# Patient Record
Sex: Male | Born: 1940 | Race: Black or African American | Hispanic: Yes | Marital: Married
Health system: Southern US, Community
[De-identification: ages and names within clinical notes are randomized; demographics above are authoritative.]

## PROBLEM LIST (undated history)

## (undated) DIAGNOSIS — G620 Drug-induced polyneuropathy: Secondary | ICD-10-CM

## (undated) DIAGNOSIS — R768 Other specified abnormal immunological findings in serum: Secondary | ICD-10-CM

## (undated) DIAGNOSIS — I509 Heart failure, unspecified: Secondary | ICD-10-CM

## (undated) DIAGNOSIS — T451X5A Adverse effect of antineoplastic and immunosuppressive drugs, initial encounter: Secondary | ICD-10-CM

## (undated) DIAGNOSIS — R7689 Other specified abnormal immunological findings in serum: Secondary | ICD-10-CM

## (undated) DIAGNOSIS — N189 Chronic kidney disease, unspecified: Secondary | ICD-10-CM

## (undated) HISTORY — DX: Adverse effect of antineoplastic and immunosuppressive drugs, initial encounter: T45.1X5A

## (undated) HISTORY — DX: Drug-induced polyneuropathy: G62.0

## (undated) HISTORY — DX: Other specified abnormal immunological findings in serum: R76.8

## (undated) HISTORY — DX: Other specified abnormal immunological findings in serum: R76.89

## (undated) HISTORY — DX: Chronic kidney disease, unspecified: N18.9

## (undated) HISTORY — DX: Heart failure, unspecified: I50.9

---

## 2017-12-16 ENCOUNTER — Other Ambulatory Visit: Payer: Self-pay

## 2017-12-16 ENCOUNTER — Encounter (HOSPITAL_COMMUNITY): Payer: Self-pay | Admitting: Hematology

## 2017-12-16 ENCOUNTER — Inpatient Hospital Stay (HOSPITAL_COMMUNITY): Payer: Medicare PPO | Attending: Hematology | Admitting: Hematology

## 2017-12-16 DIAGNOSIS — N189 Chronic kidney disease, unspecified: Secondary | ICD-10-CM | POA: Insufficient documentation

## 2017-12-16 DIAGNOSIS — G620 Drug-induced polyneuropathy: Secondary | ICD-10-CM | POA: Insufficient documentation

## 2017-12-16 DIAGNOSIS — Z8572 Personal history of non-Hodgkin lymphomas: Secondary | ICD-10-CM | POA: Diagnosis not present

## 2017-12-16 DIAGNOSIS — C8330 Diffuse large B-cell lymphoma, unspecified site: Secondary | ICD-10-CM | POA: Insufficient documentation

## 2017-12-16 DIAGNOSIS — I509 Heart failure, unspecified: Secondary | ICD-10-CM | POA: Diagnosis not present

## 2017-12-16 DIAGNOSIS — C8332 Diffuse large B-cell lymphoma, intrathoracic lymph nodes: Secondary | ICD-10-CM

## 2017-12-16 NOTE — Assessment & Plan Note (Signed)
1.  Stage IV DLBCL with bone involvement, mediastinal and right axillary lymphadenopathy: - IPI score of 3, weight loss of 20 pounds and right upper extremity swelling at presentation, PET positive in the left pelvis, acetabulum, ribs, adenopathy as described above - Treated with 6 cycles of R-CHOP from November 09, 2013 through February 22, 2014 - Does not report any B symptoms in the last 6 months.  Physical examination was within normal limits.  His blood work from 11/15/2017 was also within normal limits.  He continues to be in remission at this time.  I have discussed the results of the CT scan of the chest, abdomen and pelvis dated 11/15/2017 which did not show any evidence of adenopathy or recurrence of his lymphoma.  We will see him back in 6 months with repeat blood work.  We will plan to repeat scans in 1 year.  2.  Hepatitis B surface antigen positive: -Treated with entecavir 0.5 mg daily from March 2015 through August 2015  3.  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  Follows up with Dr. Luiz Ochoa.  4.  CKD: -Mild CKD is stable.  Creatinine is 1.33.  5.  Peripheral neuropathy: He has numbness in the fingertips and feet which is constant and stable.  Still continues to have some stiffness in the hands occasionally.

## 2017-12-16 NOTE — Progress Notes (Signed)
[facility] NOTE  Patient Care Team: Cathie Olden, MD as PCP - General (Family Medicine)  CHIEF COMPLAINTS/PURPOSE OF CONSULTATION:  Follow-up of diffuse large B-cell lymphoma.  HISTORY OF PRESENTING ILLNESS:  Christopher Briggs 77 y.o. male is seen in consultation today for follow-up of diffuse large B-cell lymphoma.  He presented in early 2015 with the 20 pound weight loss, right upper extremity swelling and right axillary adenopathy.  Scans revealed mediastinal adenopathy and bone involvement.  Biopsy was indicative of large B-cell lymphoma.  He was treated with 6 cycles of R-CHOP, which he tolerated very well.  His right upper extremity swelling subsided.  He has been on surveillance since then.  He developed anthracycline induced congestive heart failure which is also well controlled at this time.  Interval history: He is feeling very well and is seen with his wife today.  He denies any fevers, night sweats or weight loss in the last 6 months.  He denies any infections or hospitalizations.  He continues to have numbness in the fingertips and toes.  He denies any symptoms of PND or orthopnea.  No new onset pains were reported.  Appetite has been great.     MEDICAL HISTORY:  Past Medical History:  Diagnosis Date  . CHF (congestive heart failure) ([place])   . Chronic kidney disease   . Hepatitis B surface antigen positive   . Neuropathy due to chemotherapeutic drug [facility])     SURGICAL HISTORY: History reviewed. No pertinent surgical history.  SOCIAL HISTORY: Social History   Socioeconomic History  . Marital status: Married    Spouse name: Not on file  . Number of children: Not on file  . Years of education: Not on file  . Highest education level: Not on file  Occupational History  . Not on file  Social Needs  . Financial resource strain: Not on file  . Food insecurity:    Worry: Not on file    Inability: Not on file  . Transportation needs:     Medical: Not on file    Non-medical: Not on file  Tobacco Use  . Smoking status: Not on file  Substance and Sexual Activity  . Alcohol use: Not on file  . Drug use: Not on file  . Sexual activity: Not on file  Lifestyle  . Physical activity:    Days per week: Not on file    Minutes per session: Not on file  . Stress: Not on file  Relationships  . Social connections:    Talks on phone: Not on file    Gets together: Not on file    Attends religious service: Not on file    Active member of club or organization: Not on file    Attends meetings of clubs or organizations: Not on file    Relationship status: Not on file  . Intimate partner violence:    Fear of current or ex partner: Not on file    Emotionally abused: Not on file    Physically abused: Not on file    Forced sexual activity: Not on file  Other Topics Concern  . Not on file  Social History Narrative  . Not on file    FAMILY HISTORY: History reviewed. No pertinent family history.  ALLERGIES:  has no allergies on file.  MEDICATIONS:  Current Outpatient Medications  Medication Sig Dispense Refill  . carvedilol (COREG) 6.25 MG tablet     . celecoxib (CELEBREX) 200 MG capsule     .  clonazePAM (KLONOPIN) 0.5 MG tablet TAKE 1 TO 2 TABLETS BY MOUTH TWICE A DAY AS NEEDED ANXIETY AND 1 TABLET AT BEDTIME FOR SLEEP  0  . furosemide (LASIX) 40 MG tablet TAKE 1 TABLET EVERY MORNING FOR FLUID    . losartan (COZAAR) 50 MG tablet     . Magnesium Oxide 400 MG CAPS Take by mouth.    . meloxicam (MOBIC) 15 MG tablet Take by mouth.    . potassium chloride (K-DUR,KLOR-CON) 10 MEQ tablet TAKE 1 TABLET TWICE DAILY (SUBSTITUTED FOR  K-DUR)    . pravastatin (PRAVACHOL) 20 MG tablet Take by mouth.    . tamsulosin (FLOMAX) 0.4 MG CAPS capsule Take by mouth.     No current facility-administered medications for this visit.     REVIEW OF SYSTEMS:   Constitutional: Denies fevers, chills or abnormal night sweats Eyes: Denies  blurriness of vision, double vision or watery eyes Ears, nose, mouth, throat, and face: Denies mucositis or sore throat Respiratory: Denies cough, dyspnea or wheezes Cardiovascular: Denies palpitation, chest discomfort or lower extremity swelling Gastrointestinal:  Denies nausea, heartburn or change in bowel habits Skin: Denies abnormal skin rashes Lymphatics: Denies new lymphadenopathy or easy bruising Neurological: Denies any weakness but has some numbness in the fingertips and toes.   Behavioral/Psych: Mood is stable, no new changes  All other systems were reviewed with the patient and are negative.  PHYSICAL EXAMINATION: ECOG PERFORMANCE STATUS: 1 - Symptomatic but completely ambulatory  Vitals:   12/16/17 1324  BP: (!) 152/87  Pulse: 84  Resp: 16  SpO2: 98%   Filed Weights   12/16/17 1324  Weight: 234 lb (106.1 kg)    GENERAL:alert, no distress and comfortable SKIN: skin color, texture, turgor are normal, no rashes or significant lesions EYES: normal, conjunctiva are pink and non-injected, sclera clear OROPHARYNX:no exudate, no erythema and lips, buccal mucosa, and tongue normal  NECK: supple, thyroid normal size, non-tender, without nodularity LYMPH:  no palpable lymphadenopathy in the cervical, axillary or inguinal LUNGS: clear to auscultation and percussion with normal breathing effort HEART: regular rate & rhythm and no murmurs and no lower extremity edema ABDOMEN:abdomen soft, non-tender and normal bowel sounds Musculoskeletal:no cyanosis of digits and no clubbing  PSYCH: alert & oriented x 3 with fluent speech   LABORATORY DATA:  I have reviewed the data as listed No results found for: WBC, HGB, HCT, MCV, PLT   Chemistry   No results found for: NA, K, CL, CO2, BUN, CREATININE, GLU No results found for: CALCIUM, ALKPHOS, AST, ALT, BILITOT     RADIOGRAPHIC STUDIES: I have personally reviewed the radiological images as listed and agreed with the findings in the  report.  ASSESSMENT & PLAN:  Diffuse large B cell lymphoma (HCC) 1.  Stage IV DLBCL with bone involvement, mediastinal and right axillary lymphadenopathy: - IPI score of 3, weight loss of 20 pounds and right upper extremity swelling at presentation, PET positive in the left pelvis, acetabulum, ribs, adenopathy as described above - Treated with 6 cycles of R-CHOP from November 09, 2013 through February 22, 2014 - Does not report any B symptoms in the last 6 months.  Physical examination was within normal limits.  His blood work from 11/15/2017 was also within normal limits.  He continues to be in remission at this time.  I have discussed the results of the CT scan of the chest, abdomen and pelvis dated 11/15/2017 which did not show any evidence of adenopathy or recurrence of his  lymphoma.  We will see him back in 6 months with repeat blood work.  We will plan to repeat scans in 1 year.  2.  Hepatitis B surface antigen positive: -Treated with entecavir 0.5 mg daily from March 2015 through August 2015  3.  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  Follows up with Dr. Luiz Ochoa.  4.  CKD: -Mild CKD is stable.  Creatinine is 1.33.  5.  Peripheral neuropathy: He has numbness in the fingertips and feet which is constant and stable.  Still continues to have some stiffness in the hands occasionally.  No orders of the defined types were placed in this encounter.   All questions were answered. The patient knows to call the clinic with any problems, questions or concerns.      Derek Jack, MD 12/16/2017 5:48 PM

## 2017-12-17 ENCOUNTER — Encounter (HOSPITAL_COMMUNITY): Payer: Self-pay | Admitting: General Practice

## 2017-12-18 ENCOUNTER — Ambulatory Visit
Admission: RE | Admit: 2017-12-18 | Discharge: 2017-12-18 | Disposition: A | Discharge status: Home / Self Care | Payer: Self-pay | Source: Ambulatory Visit | Attending: Hematology | Admitting: Hematology

## 2017-12-18 ENCOUNTER — Other Ambulatory Visit (HOSPITAL_COMMUNITY): Payer: Self-pay | Admitting: Hematology

## 2017-12-18 DIAGNOSIS — R1084 Generalized abdominal pain: Secondary | ICD-10-CM

## 2018-01-21 NOTE — Progress Notes (Signed)
Faxed

## 2018-06-18 ENCOUNTER — Other Ambulatory Visit (HOSPITAL_COMMUNITY): Payer: Self-pay

## 2018-06-18 DIAGNOSIS — C8332 Diffuse large B-cell lymphoma, intrathoracic lymph nodes: Secondary | ICD-10-CM

## 2018-06-19 ENCOUNTER — Ambulatory Visit (HOSPITAL_COMMUNITY): Payer: Medicare PPO | Admitting: Hematology

## 2018-06-19 ENCOUNTER — Other Ambulatory Visit (HOSPITAL_COMMUNITY): Payer: Medicare PPO

## 2018-08-18 ENCOUNTER — Other Ambulatory Visit (HOSPITAL_COMMUNITY): Payer: Medicare PPO

## 2018-08-25 ENCOUNTER — Ambulatory Visit (HOSPITAL_COMMUNITY): Payer: Medicare PPO | Admitting: Hematology

## 2018-08-28 ENCOUNTER — Encounter (HOSPITAL_COMMUNITY): Payer: Self-pay | Admitting: Hematology

## 2018-08-28 ENCOUNTER — Inpatient Hospital Stay (HOSPITAL_COMMUNITY): Payer: Medicare Other | Attending: Hematology

## 2018-08-28 ENCOUNTER — Inpatient Hospital Stay (HOSPITAL_BASED_OUTPATIENT_CLINIC_OR_DEPARTMENT_OTHER): Payer: Medicare Other | Admitting: Hematology

## 2018-08-28 ENCOUNTER — Other Ambulatory Visit: Payer: Self-pay

## 2018-08-28 VITALS — BP 148/93 | HR 76 | Temp 98.0°F | Resp 16 | Wt 229.0 lb

## 2018-08-28 DIAGNOSIS — Z8572 Personal history of non-Hodgkin lymphomas: Secondary | ICD-10-CM

## 2018-08-28 DIAGNOSIS — I509 Heart failure, unspecified: Secondary | ICD-10-CM

## 2018-08-28 DIAGNOSIS — N189 Chronic kidney disease, unspecified: Secondary | ICD-10-CM

## 2018-08-28 DIAGNOSIS — C8332 Diffuse large B-cell lymphoma, intrathoracic lymph nodes: Secondary | ICD-10-CM

## 2018-08-28 DIAGNOSIS — G620 Drug-induced polyneuropathy: Secondary | ICD-10-CM

## 2018-08-28 DIAGNOSIS — T451X5A Adverse effect of antineoplastic and immunosuppressive drugs, initial encounter: Secondary | ICD-10-CM

## 2018-08-28 LAB — COMPREHENSIVE METABOLIC PANEL
ALBUMIN: 4 g/dL (ref 3.5–5.0)
ALT: 24 U/L (ref 0–44)
AST: 26 U/L (ref 15–41)
Alkaline Phosphatase: 64 U/L (ref 38–126)
Anion gap: 7 (ref 5–15)
BILIRUBIN TOTAL: 1.1 mg/dL (ref 0.3–1.2)
BUN: 17 mg/dL (ref 8–23)
CO2: 26 mmol/L (ref 22–32)
CREATININE: 1.17 mg/dL (ref 0.61–1.24)
Calcium: 9.2 mg/dL (ref 8.9–10.3)
Chloride: 107 mmol/L (ref 98–111)
GFR calc Af Amer: >60 mL/min (ref >60)
GFR, EST NON AFRICAN AMERICAN: 60 mL/min — AB (ref >60)
GLUCOSE: 105 mg/dL — AB (ref 70–99)
Potassium: 4.6 mmol/L (ref 3.5–5.1)
Sodium: 140 mmol/L (ref 135–145)
TOTAL PROTEIN: 6.6 g/dL (ref 6.5–8.1)

## 2018-08-28 LAB — CBC WITH DIFFERENTIAL/PLATELET
ABS IMMATURE GRANULOCYTES: 0.03 10*3/uL (ref 0.00–0.07)
Basophils Absolute: 0 10*3/uL (ref 0.0–0.1)
Basophils Relative: 0 %
Eosinophils Absolute: 0.3 10*3/uL (ref 0.0–0.5)
Eosinophils Relative: 3 %
HEMATOCRIT: 50.2 % (ref 39.0–52.0)
Hemoglobin: 15.7 g/dL (ref 13.0–17.0)
IMMATURE GRANULOCYTES: 0 %
LYMPHS ABS: 1.7 10*3/uL (ref 0.7–4.0)
Lymphocytes Relative: 19 %
MCH: 27.8 pg (ref 26.0–34.0)
MCHC: 31.3 g/dL (ref 30.0–36.0)
MCV: 89 fL (ref 80.0–100.0)
MONOS PCT: 7 %
Monocytes Absolute: 0.7 10*3/uL (ref 0.1–1.0)
NEUTROS ABS: 6.2 10*3/uL (ref 1.7–7.7)
NEUTROS PCT: 71 %
Platelets: 177 10*3/uL (ref 150–400)
RBC: 5.64 MIL/uL (ref 4.22–5.81)
RDW: 14.3 % (ref 11.5–15.5)
WBC: 8.9 10*3/uL (ref 4.0–10.5)
nRBC: 0 % (ref 0.0–0.2)

## 2018-08-28 LAB — LACTATE DEHYDROGENASE: LDH: 192 U/L (ref 98–192)

## 2018-08-28 MED ORDER — SODIUM CHLORIDE 0.9% FLUSH
10.0000 mL | INTRAVENOUS | Status: DC | PRN
  Administered 2018-08-28: 10 mL via INTRAVENOUS
  Filled 2018-08-28: qty 10

## 2018-08-28 MED ORDER — HEPARIN SOD (PORK) LOCK FLUSH 100 UNIT/ML IV SOLN
500.0000 [IU] | Freq: Once | INTRAVENOUS | Status: AC
  Administered 2018-08-28: 500 [IU] via INTRAVENOUS

## 2018-08-28 NOTE — Progress Notes (Signed)
[address], [place] [address]   CLINIC:  Medical Oncology/Hematology  PCP:  Cathie Olden, [place] [id number] [phone]   REASON FOR VISIT: Follow-up for diffuse large B-cell lymphoma.  PREVIOUS THERAPY: RCHOP  CURRENT THERAPY: observation  BRIEF ONCOLOGIC HISTORY:    Diffuse large B cell lymphoma ([place])   10/2013 Initial Diagnosis    Diffuse large B cell lymphoma ([place])    11/09/2013 - 02/22/2014 Chemotherapy    6 cycles of R-CHOP     05/25/2014 Adverse Reaction    Congestive heart failure, anthracycline induced - Echo on 05/25/2014 shows EF of 30 to 35% - Improved EF of 50 to 55% on echo in March 2016    11/15/2017 Imaging    CT scan of the chest, abdomen and pelvis did not show any evidence of adenopathy or recurrence.     INTERVAL HISTORY:  Mr. Montesdeoca 78 y.o. male returns for routine follow-up for diffuse large B-cell lymphoma. He does report having numbness in his finger tips and feet. He reports it has been a little worse in the past 6 months. Some days he has problems buttoning his shirt other days he can do it. Denies any nausea, vomiting, or diarrhea. Denies any new pains. Had not noticed any recent bleeding such as epistaxis, hematuria or hematochezia. Denies recent chest pain on exertion, shortness of breath on minimal exertion, pre-syncopal episodes, or palpitations. Denies any recent fevers, infections, or recent hospitalizations. Patient reports appetite at 100% and energy level at 100%. He is maintaining his weight at this time.    REVIEW OF SYSTEMS:  Review of Systems  Neurological: Positive for numbness.  All other systems reviewed and are negative.    PAST MEDICAL/SURGICAL HISTORY:  Past Medical History:  Diagnosis Date  . CHF (congestive heart failure) ([place])   . Chronic kidney disease   . Hepatitis B surface antigen positive   . Neuropathy due to chemotherapeutic drug  [facility])    History reviewed. No pertinent surgical history.   SOCIAL HISTORY:  Social History   Socioeconomic History  . Marital status: Married    Spouse name: Not on file  . Number of children: Not on file  . Years of education: Not on file  . Highest education level: Not on file  Occupational History  . Not on file  Social Needs  . Financial resource strain: Not on file  . Food insecurity:    Worry: Not on file    Inability: Not on file  . Transportation needs:    Medical: Not on file    Non-medical: Not on file  Tobacco Use  . Smoking status: Not on file  Substance and Sexual Activity  . Alcohol use: Not on file  . Drug use: Not on file  . Sexual activity: Not on file  Lifestyle  . Physical activity:    Days per week: Not on file    Minutes per session: Not on file  . Stress: Not on file  Relationships  . Social connections:    Talks on phone: Not on file    Gets together: Not on file    Attends religious service: Not on file    Active member of club or organization: Not on file    Attends meetings of clubs or organizations: Not on file    Relationship status: Not on file  . Intimate partner violence:    Fear of current or ex partner: Not  on file    Emotionally abused: Not on file    Physically abused: Not on file    Forced sexual activity: Not on file  Other Topics Concern  . Not on file  Social History Narrative  . Not on file    FAMILY HISTORY:  History reviewed. No pertinent family history.  CURRENT MEDICATIONS:  Outpatient Encounter Medications as of 08/28/2018  Medication Sig  . furosemide (LASIX) 40 MG tablet TAKE 1 TABLET EVERY MORNING FOR FLUID  . pravastatin (PRAVACHOL) 20 MG tablet Take by mouth.  . tamsulosin (FLOMAX) 0.4 MG CAPS capsule Take by mouth.  . vitamin C (ASCORBIC ACID) 500 MG tablet Take by mouth.  . clonazePAM (KLONOPIN) 0.5 MG tablet TAKE 1 TO 2 TABLETS BY MOUTH TWICE A DAY AS NEEDED ANXIETY AND 1 TABLET AT BEDTIME FOR SLEEP    . [DISCONTINUED] bisacodyl (DULCOLAX) 5 MG EC tablet Take by mouth.  . [DISCONTINUED] carvedilol (COREG) 6.25 MG tablet   . [DISCONTINUED] celecoxib (CELEBREX) 200 MG capsule   . [DISCONTINUED] Docusate Sodium 100 MG capsule Take by mouth.  . [DISCONTINUED] ferrous sulfate 325 (65 FE) MG tablet Take by mouth.  . [DISCONTINUED] losartan (COZAAR) 50 MG tablet   . [DISCONTINUED] Magnesium Oxide 400 MG CAPS Take by mouth.  . [DISCONTINUED] meloxicam (MOBIC) 15 MG tablet Take by mouth.  . [DISCONTINUED] potassium chloride (K-DUR,KLOR-CON) 10 MEQ tablet TAKE 1 TABLET TWICE DAILY (SUBSTITUTED FOR  K-DUR)   Facility-Administered Encounter Medications as of 08/28/2018  Medication  . [COMPLETED] heparin lock flush 100 unit/mL  . sodium chloride flush (NS) 0.9 % injection 10 mL    ALLERGIES:  Not on File   PHYSICAL EXAM:  ECOG Performance status: 1  Vitals:   08/28/18 1126  BP: (!) 148/93  Pulse: 76  Resp: 16  Temp: 98 F (36.7 C)  SpO2: 99%   Filed Weights   08/28/18 1126  Weight: 229 lb (103.9 kg)    Physical Exam Constitutional:      Appearance: Normal appearance. He is normal weight.  Cardiovascular:     Rate and Rhythm: Normal rate and regular rhythm.     Heart sounds: Normal heart sounds.  Pulmonary:     Effort: Pulmonary effort is normal.     Breath sounds: Normal breath sounds.  Abdominal:     General: Abdomen is flat.     Palpations: Abdomen is soft.  Musculoskeletal: Normal range of motion.  Skin:    General: Skin is warm and dry.  Neurological:     Mental Status: He is alert and oriented to person, place, and time. Mental status is at baseline.  Psychiatric:        Mood and Affect: Mood normal.        Behavior: Behavior normal.        Thought Content: Thought content normal.        Judgment: Judgment normal.    No palpable adenopathy or splenomegaly. Chest is bilaterally clear to auscultation. Cardiovascular: S1-S2 regular rate and rhythm.  LABORATORY  DATA:  I have reviewed the labs as listed.  CBC    Component Value Date/Time   WBC 8.9 08/28/2018 1034   RBC 5.64 08/28/2018 1034   HGB 15.7 08/28/2018 1034   HCT 50.2 08/28/2018 1034   PLT 177 08/28/2018 1034   MCV 89.0 08/28/2018 1034   MCH 27.8 08/28/2018 1034   MCHC 31.3 08/28/2018 1034   RDW 14.3 08/28/2018 1034   LYMPHSABS 1.7 08/28/2018 1034  MONOABS 0.7 08/28/2018 1034   EOSABS 0.3 08/28/2018 1034   BASOSABS 0.0 08/28/2018 1034   CMP Latest Ref Rng & Units 08/28/2018  Glucose 70 - 99 mg/dL 105(H)  BUN 8 - 23 mg/dL 17  Creatinine 0.61 - 1.24 mg/dL 1.17  Sodium 135 - 145 mmol/L 140  Potassium 3.5 - 5.1 mmol/L 4.6  Chloride 98 - 111 mmol/L 107  CO2 22 - 32 mmol/L 26  Calcium 8.9 - 10.3 mg/dL 9.2  Total Protein 6.5 - 8.1 g/dL 6.6  Total Bilirubin 0.3 - 1.2 mg/dL 1.1  Alkaline Phos 38 - 126 U/L 64  AST 15 - 41 U/L 26  ALT 0 - 44 U/L 24       DIAGNOSTIC IMAGING:  I have independently reviewed the scans and discussed with the patient.   I have reviewed Francene Finders, NP's note and agree with the documentation.  I personally performed a face-to-face visit, made revisions and my assessment and plan is as follows.    ASSESSMENT & PLAN:   Diffuse large B cell lymphoma (HCC) 1.  Stage IV DLBCL with bone involvement, mediastinal and right axillary lymphadenopathy: - IPI score of 3, weight loss of 20 pounds and right upper extremity swelling at presentation, PET positive in the left pelvis, acetabulum, ribs, adenopathy as described above - Treated with 6 cycles of R-CHOP from November 09, 2013 through February 22, 2014 - CT CAP on 11/15/2017 did not show any evidence of adenopathy or recurrence of lymphoma. -Physical examination today did not reveal any palpable adenopathy.  He denies having any fevers, night sweats or weight loss in the last 6 months.  No splenomegaly.  His blood work today was within normal limits. - We will see him back in 6 months for follow-up.  Plan to  repeat blood work and last CT scans prior to next visit.  2.  Hepatitis B surface antigen positive: -Treated with entecavir 0.5 mg daily from March 2015 through August 2015  3.  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  Follows up with Dr. Luiz Ochoa.  4.  CKD: -Mild CKD is stable.  Creatinine today is 1.17.  5.  Peripheral neuropathy: -He reports that numbness in the fingertips and feet has slightly worsened in the last 6 months. -He is not having any pins-and-needles sensation at nighttime.  Hence I would not start on any medication at this time.  To do all activities without any problems.        Orders placed this encounter:  Orders Placed This Encounter  Procedures  . CT CHEST W CONTRAST  . CT Abdomen Pelvis W Contrast  . Lactate dehydrogenase  . CBC with Differential/Platelet  . Comprehensive metabolic panel  . Lactate dehydrogenase  . Draw extra clot tube      Derek Jack, MD [place] [phone]

## 2018-08-28 NOTE — Progress Notes (Signed)
Christopher Briggs tolerated portacath flush well without complaints or incident. Port accessed with 20 gauge needle with blood return noted then flushed with 10 ml NS and 5 ml Heparin easily per protocol then de-accessed. Pt discharged self ambulatory in satisfactory condition

## 2018-08-28 NOTE — Patient Instructions (Signed)
[facility] at [facility] Discharge Instructions     Thank you for choosing [facility] at [facility] to provide your oncology and hematology care.  To afford each patient quality time with our provider, please arrive at least 15 minutes before your scheduled appointment time.   If you have a lab appointment with the Cancer Center please come in thru the  Main Entrance and check in at the main information desk  You need to re-schedule your appointment should you arrive 10 or more minutes late.  We strive to give you quality time with our providers, and arriving late affects you and other patients whose appointments are after yours.  Also, if you no show three or more times for appointments you may be dismissed from the clinic at the providers discretion.     Again, thank you for choosing [facility].  Our hope is that these requests will decrease the amount of time that you wait before being seen by our physicians.       _____________________________________________________________  Should you have questions after your visit to [facility], please contact our office at [phone] between the hours of 8:00 a.m. and 4:30 p.m.  Voicemails left after 4:00 p.m. will not be returned until the following business day.  For prescription refill requests, have your pharmacy contact our office and allow 72 hours.    Cancer Center Support Programs:   > Cancer Support Group  2nd Tuesday of the month 1pm-2pm, Journey Room    

## 2018-08-28 NOTE — Assessment & Plan Note (Signed)
1.  Stage IV DLBCL with bone involvement, mediastinal and right axillary lymphadenopathy: - IPI score of 3, weight loss of 20 pounds and right upper extremity swelling at presentation, PET positive in the left pelvis, acetabulum, ribs, adenopathy as described above - Treated with 6 cycles of R-CHOP from November 09, 2013 through February 22, 2014 - CT CAP on 11/15/2017 did not show any evidence of adenopathy or recurrence of lymphoma. -Physical examination today did not reveal any palpable adenopathy.  He denies having any fevers, night sweats or weight loss in the last 6 months.  No splenomegaly.  His blood work today was within normal limits. - We will see him back in 6 months for follow-up.  Plan to repeat blood work and last CT scans prior to next visit.  2.  Hepatitis B surface antigen positive: -Treated with entecavir 0.5 mg daily from March 2015 through August 2015  3.  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  Follows up with Dr. Luiz Ochoa.  4.  CKD: -Mild CKD is stable.  Creatinine today is 1.17.  5.  Peripheral neuropathy: -He reports that numbness in the fingertips and feet has slightly worsened in the last 6 months. -He is not having any pins-and-needles sensation at nighttime.  Hence I would not start on any medication at this time.  To do all activities without any problems.

## 2019-02-27 ENCOUNTER — Inpatient Hospital Stay (HOSPITAL_COMMUNITY): Payer: Medicare Other | Attending: Hematology

## 2019-02-27 ENCOUNTER — Ambulatory Visit (HOSPITAL_COMMUNITY)
Admission: RE | Admit: 2019-02-27 | Discharge: 2019-02-27 | Disposition: A | Discharge status: Home / Self Care | Payer: Medicare Other | Source: Ambulatory Visit | Attending: Nurse Practitioner | Admitting: Nurse Practitioner

## 2019-02-27 ENCOUNTER — Encounter (HOSPITAL_COMMUNITY): Payer: Self-pay

## 2019-02-27 ENCOUNTER — Other Ambulatory Visit: Payer: Self-pay

## 2019-02-27 DIAGNOSIS — N400 Enlarged prostate without lower urinary tract symptoms: Secondary | ICD-10-CM | POA: Diagnosis not present

## 2019-02-27 DIAGNOSIS — Z8572 Personal history of non-Hodgkin lymphomas: Secondary | ICD-10-CM | POA: Diagnosis not present

## 2019-02-27 DIAGNOSIS — M12812 Other specific arthropathies, not elsewhere classified, left shoulder: Secondary | ICD-10-CM | POA: Diagnosis not present

## 2019-02-27 DIAGNOSIS — K769 Liver disease, unspecified: Secondary | ICD-10-CM | POA: Diagnosis not present

## 2019-02-27 DIAGNOSIS — Z9221 Personal history of antineoplastic chemotherapy: Secondary | ICD-10-CM | POA: Diagnosis not present

## 2019-02-27 DIAGNOSIS — G629 Polyneuropathy, unspecified: Secondary | ICD-10-CM | POA: Insufficient documentation

## 2019-02-27 DIAGNOSIS — I700 Atherosclerosis of aorta: Secondary | ICD-10-CM | POA: Insufficient documentation

## 2019-02-27 DIAGNOSIS — N289 Disorder of kidney and ureter, unspecified: Secondary | ICD-10-CM | POA: Diagnosis not present

## 2019-02-27 DIAGNOSIS — I509 Heart failure, unspecified: Secondary | ICD-10-CM | POA: Diagnosis not present

## 2019-02-27 DIAGNOSIS — M12811 Other specific arthropathies, not elsewhere classified, right shoulder: Secondary | ICD-10-CM | POA: Insufficient documentation

## 2019-02-27 DIAGNOSIS — C8332 Diffuse large B-cell lymphoma, intrathoracic lymph nodes: Secondary | ICD-10-CM

## 2019-02-27 LAB — POCT I-STAT CREATININE: Creatinine, Ser: 1.2 mg/dL (ref 0.61–1.24)

## 2019-02-27 LAB — CBC WITH DIFFERENTIAL/PLATELET
Abs Immature Granulocytes: 0.01 10*3/uL (ref 0.00–0.07)
Basophils Absolute: 0 10*3/uL (ref 0.0–0.1)
Basophils Relative: 1 %
Eosinophils Absolute: 0.3 10*3/uL (ref 0.0–0.5)
Eosinophils Relative: 5 %
HCT: 50.9 % (ref 39.0–52.0)
Hemoglobin: 16.5 g/dL (ref 13.0–17.0)
Immature Granulocytes: 0 %
Lymphocytes Relative: 26 %
Lymphs Abs: 1.7 10*3/uL (ref 0.7–4.0)
MCH: 28.6 pg (ref 26.0–34.0)
MCHC: 32.4 g/dL (ref 30.0–36.0)
MCV: 88.4 fL (ref 80.0–100.0)
Monocytes Absolute: 0.5 10*3/uL (ref 0.1–1.0)
Monocytes Relative: 8 %
Neutro Abs: 3.9 10*3/uL (ref 1.7–7.7)
Neutrophils Relative %: 60 %
Platelets: 202 10*3/uL (ref 150–400)
RBC: 5.76 MIL/uL (ref 4.22–5.81)
RDW: 12.9 % (ref 11.5–15.5)
WBC: 6.5 10*3/uL (ref 4.0–10.5)
nRBC: 0 % (ref 0.0–0.2)

## 2019-02-27 LAB — COMPREHENSIVE METABOLIC PANEL
ALT: 16 U/L (ref 0–44)
AST: 24 U/L (ref 15–41)
Albumin: 4.2 g/dL (ref 3.5–5.0)
Alkaline Phosphatase: 79 U/L (ref 38–126)
Anion gap: 8 (ref 5–15)
BUN: 21 mg/dL (ref 8–23)
CO2: 26 mmol/L (ref 22–32)
Calcium: 9.2 mg/dL (ref 8.9–10.3)
Chloride: 103 mmol/L (ref 98–111)
Creatinine, Ser: 1.18 mg/dL (ref 0.61–1.24)
GFR calc Af Amer: >60 mL/min (ref >60)
GFR calc non Af Amer: 59 mL/min — ABNORMAL LOW (ref >60)
Glucose, Bld: 89 mg/dL (ref 70–99)
Potassium: 4.5 mmol/L (ref 3.5–5.1)
Sodium: 137 mmol/L (ref 135–145)
Total Bilirubin: 1.2 mg/dL (ref 0.3–1.2)
Total Protein: 6.6 g/dL (ref 6.5–8.1)

## 2019-02-27 LAB — LACTATE DEHYDROGENASE: LDH: 180 U/L (ref 98–192)

## 2019-02-27 MED ORDER — IOHEXOL 350 MG/ML SOLN
100.0000 mL | Freq: Once | INTRAVENOUS | Status: AC | PRN
  Administered 2019-02-27: 100 mL via INTRAVENOUS

## 2019-02-27 MED ORDER — IOHEXOL 300 MG/ML  SOLN: 100.0000 mL | Freq: Once | INTRAMUSCULAR | Status: DC | PRN

## 2019-03-05 ENCOUNTER — Other Ambulatory Visit: Payer: Self-pay

## 2019-03-05 ENCOUNTER — Inpatient Hospital Stay (HOSPITAL_COMMUNITY): Payer: Medicare Other

## 2019-03-05 ENCOUNTER — Inpatient Hospital Stay (HOSPITAL_BASED_OUTPATIENT_CLINIC_OR_DEPARTMENT_OTHER): Payer: Medicare Other | Admitting: Hematology

## 2019-03-05 ENCOUNTER — Encounter (HOSPITAL_COMMUNITY): Payer: Self-pay | Admitting: Hematology

## 2019-03-05 VITALS — BP 141/99 | HR 83 | Temp 97.5°F | Resp 16 | Wt 232.8 lb

## 2019-03-05 DIAGNOSIS — G629 Polyneuropathy, unspecified: Secondary | ICD-10-CM | POA: Diagnosis not present

## 2019-03-05 DIAGNOSIS — N400 Enlarged prostate without lower urinary tract symptoms: Secondary | ICD-10-CM

## 2019-03-05 DIAGNOSIS — Z9221 Personal history of antineoplastic chemotherapy: Secondary | ICD-10-CM

## 2019-03-05 DIAGNOSIS — I509 Heart failure, unspecified: Secondary | ICD-10-CM | POA: Diagnosis not present

## 2019-03-05 DIAGNOSIS — C8332 Diffuse large B-cell lymphoma, intrathoracic lymph nodes: Secondary | ICD-10-CM

## 2019-03-05 DIAGNOSIS — Z8572 Personal history of non-Hodgkin lymphomas: Secondary | ICD-10-CM

## 2019-03-05 LAB — PSA: Prostatic Specific Antigen: 3.53 ng/mL (ref 0.00–4.00)

## 2019-03-05 NOTE — Progress Notes (Signed)
[place], [place] [address]   CLINIC:  Medical Oncology/Hematology  PCP:  Cathie Olden, [place] [address] [phone]   REASON FOR VISIT: Follow-up for diffuse large B-cell lymphoma.  PREVIOUS THERAPY: RCHOP  CURRENT THERAPY: observation  BRIEF ONCOLOGIC HISTORY:  Oncology History  Diffuse large B cell lymphoma ([place])  10/2013 Initial Diagnosis   Diffuse large B cell lymphoma ([place])   11/09/2013 - 02/22/2014 Chemotherapy   6 cycles of R-CHOP    05/25/2014 Adverse Reaction   Congestive heart failure, anthracycline induced - Echo on 05/25/2014 shows EF of 30 to 35% - Improved EF of 50 to 55% on echo in March 2016   11/15/2017 Imaging   CT scan of the chest, abdomen and pelvis did not show any evidence of adenopathy or recurrence.     INTERVAL HISTORY:  Christopher Briggs 78 y.o. male seen for follow-up of diffuse large B-cell lymphoma.  Numbness in the fingertips and feet has been stable.  Denies any fevers, night sweats or weight loss in the last 6 months.  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  Appetite is 100%.  Energy levels are 100%.  He does report some difficulty initiating stream of the urine.  He is taking Flomax and follows up with Dr. Lerry Liner in [place].  Denies any ER visits or hospitalizations.  Denies any nausea vomiting diarrhea or constipation.  No bleeding per rectum or melena.  REVIEW OF SYSTEMS:  Review of Systems  Genitourinary: Positive for difficulty urinating.   Neurological: Positive for numbness.  All other systems reviewed and are negative.    PAST MEDICAL/SURGICAL HISTORY:  Past Medical History:  Diagnosis Date  . CHF (congestive heart failure) ([place])   . Chronic kidney disease   . Hepatitis B surface antigen positive   . Neuropathy due to chemotherapeutic drug [facility])    History reviewed. No pertinent surgical history.   SOCIAL HISTORY:  Social History   Socioeconomic History  . Marital status: Married    Spouse name: Not on file  . Number of children: Not on file  . Years of education: Not on file  . Highest education level: Not on file  Occupational History  . Not on file  Social Needs  . Financial resource strain: Not on file  . Food insecurity    Worry: Not on file    Inability: Not on file  . Transportation needs    Medical: Not on file    Non-medical: Not on file  Tobacco Use  . Smoking status: Not on file  Substance and Sexual Activity  . Alcohol use: Not on file  . Drug use: Not on file  . Sexual activity: Not on file  Lifestyle  . Physical activity    Days per week: Not on file    Minutes per session: Not on file  . Stress: Not on file  Relationships  . Social Herbalist on phone: Not on file    Gets together: Not on file    Attends religious service: Not on file    Active member of club or organization: Not on file    Attends meetings of clubs or organizations: Not on file    Relationship status: Not on file  . Intimate partner violence    Fear of current or ex partner: Not on file    Emotionally abused: Not on file    Physically abused: Not  on file    Forced sexual activity: Not on file  Other Topics Concern  . Not on file  Social History Narrative  . Not on file    FAMILY HISTORY:  History reviewed. No pertinent family history.  CURRENT MEDICATIONS:  Outpatient Encounter Medications as of 03/05/2019  Medication Sig  . pantoprazole (PROTONIX) 40 MG tablet Take 40 mg by mouth daily.   . [DISCONTINUED] pravastatin (PRAVACHOL) 20 MG tablet Take by mouth.  . carvedilol (COREG) 6.25 MG tablet TAKE 1 TABLET BY MOUTH TWICE DAILY FOR BLOOD PRESSURE  . clonazePAM (KLONOPIN) 0.5 MG tablet TAKE 1 TO 2 TABLETS BY MOUTH TWICE A DAY AS NEEDED ANXIETY AND 1 TABLET AT BEDTIME FOR SLEEP  . furosemide (LASIX) 40 MG tablet TAKE 1 TABLET EVERY MORNING FOR FLUID  . losartan (COZAAR) 50 MG tablet TAKE 1 TABLET  BY MOUTH IN THE MORNING FOR BLOOD PRESSURE  . pravastatin (PRAVACHOL) 20 MG tablet Take 20 mg by mouth daily.   . tamsulosin (FLOMAX) 0.4 MG CAPS capsule Take 0.4 mg by mouth daily.   . vitamin C (ASCORBIC ACID) 500 MG tablet Take 500 mg by mouth daily.    No facility-administered encounter medications on file as of 03/05/2019.     ALLERGIES:  Allergies  Allergen Reactions  . Ace Inhibitors Other (See Comments)    cough   . Atorvastatin Other (See Comments)    Dry mouth     PHYSICAL EXAM:  ECOG Performance status: 1  Vitals:   03/05/19 1049  BP: (!) 141/99  Pulse: 83  Resp: 16  Temp: (!) 97.5 F (36.4 C)  SpO2: 98%   Filed Weights   03/05/19 1049  Weight: 232 lb 12.8 oz (105.6 kg)    Physical Exam Constitutional:      Appearance: Normal appearance. He is normal weight.  Cardiovascular:     Rate and Rhythm: Normal rate and regular rhythm.     Heart sounds: Normal heart sounds.  Pulmonary:     Effort: Pulmonary effort is normal.     Breath sounds: Normal breath sounds.  Abdominal:     General: There is no distension.     Palpations: Abdomen is soft. There is no mass.  Musculoskeletal: Normal range of motion.  Lymphadenopathy:     Cervical: No cervical adenopathy.  Skin:    General: Skin is warm and dry.  Neurological:     Mental Status: He is alert and oriented to person, place, and time. Mental status is at baseline.  Psychiatric:        Mood and Affect: Mood normal.        Behavior: Behavior normal.     LABORATORY DATA:  I have reviewed the labs as listed.  CBC    Component Value Date/Time   WBC 6.5 02/27/2019 1301   RBC 5.76 02/27/2019 1301   HGB 16.5 02/27/2019 1301   HCT 50.9 02/27/2019 1301   PLT 202 02/27/2019 1301   MCV 88.4 02/27/2019 1301   MCH 28.6 02/27/2019 1301   MCHC 32.4 02/27/2019 1301   RDW 12.9 02/27/2019 1301   LYMPHSABS 1.7 02/27/2019 1301   MONOABS 0.5 02/27/2019 1301   EOSABS 0.3 02/27/2019 1301   BASOSABS 0.0  02/27/2019 1301   CMP Latest Ref Rng & Units 02/27/2019 02/27/2019 08/28/2018  Glucose 70 - 99 mg/dL - 89 105(H)  BUN 8 - 23 mg/dL - 21 17  Creatinine 0.61 - 1.24 mg/dL 1.20 1.18 1.17  Sodium 135 - 145  mmol/L - 137 140  Potassium 3.5 - 5.1 mmol/L - 4.5 4.6  Chloride 98 - 111 mmol/L - 103 107  CO2 22 - 32 mmol/L - 26 26  Calcium 8.9 - 10.3 mg/dL - 9.2 9.2  Total Protein 6.5 - 8.1 g/dL - 6.6 6.6  Total Bilirubin 0.3 - 1.2 mg/dL - 1.2 1.1  Alkaline Phos 38 - 126 U/L - 79 64  AST 15 - 41 U/L - 24 26  ALT 0 - 44 U/L - 16 24       DIAGNOSTIC IMAGING:  I have independently reviewed the scans and discussed with the patient.     ASSESSMENT & PLAN:   Diffuse large B cell lymphoma (HCC) 1.  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 -Treated with 6 cycles of R-CHOP from November 09, 2013 through February 22, 2014 nodule. - Physical examination today did not reveal any palpable adenopathy or splenomegaly. -He denies any fevers, night sweats or weight loss in the last 6 months. - We reviewed results of the CT CAP from 02/27/2019.  No adenopathy currently identified.  Normal spleen.  3 liver lesions thought to be hemangiomas.  2 liver lesions consistent with cysts.  Small exophytic lesion of the left kidney upper pole which is technically too small to characterize although could represent a cyst.  Markedly enlarged prostate gland with volume of 210 cm.  There is some asymmetric soft tissue density along the right posterior seminal vesicle. - We have also reviewed his labs.  LDH is within normal limits.  He will come back in 6 months for follow-up.  I have also checked his PSA today which was 3.5.  2.  Hepatitis B surface antigen positive: -Treated with entecavir 0.5 mg daily  from March 2015 through August 2015 while he was receiving chemotherapy.  3.  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 -This improved to 50 to 55% in May 2016.  Follows up with Dr. Luiz Ochoa.  4.  Peripheral neuropathy: - He has numbness in the fingertips.  He also has numbness in the feet.  They do not hurt. -She is not on any medication.  Total time spent is 25 minutes with more than 50% of time spent face-to-face discussing lab results, scan results, surveillance, counseling and coordination of care.    Orders placed this encounter:  Orders Placed This Encounter  Procedures  . PSA  . CBC with Differential/Platelet  . Comprehensive metabolic panel  . Lactate dehydrogenase  . PSA      Derek Jack, MD [place] [phone]

## 2019-03-05 NOTE — Patient Instructions (Addendum)
[place] at [facility] Discharge Instructions  You were seen today by Dr. Delton Coombes. He went over your recent lab and scan results. Please follow up with your doctor about your enlarged prostate. He will see you back in 6 months for labs and follow up.   Thank you for choosing [place] at [facility] to provide your oncology and hematology care.  To afford each patient quality time with our provider, please arrive at least 15 minutes before your scheduled appointment time.   If you have a lab appointment with the [facility] please come in thru the  Main Entrance and check in at the main information desk  You need to re-schedule your appointment should you arrive 10 or more minutes late.  We strive to give you quality time with our providers, and arriving late affects you and other patients whose appointments are after yours.  Also, if you no show three or more times for appointments you may be dismissed from the clinic at the providers discretion.     Again, thank you for choosing [facility].  Our hope is that these requests will decrease the amount of time that you wait before being seen by our physicians.       _____________________________________________________________  Should you have questions after your visit to [facility], please contact our office at [phone] between the hours of 8:00 a.m. and 4:30 p.m.  Voicemails left after 4:00 p.m. will not be returned until the following business day.  For prescription refill requests, have your pharmacy contact our office and allow 72 hours.    Cancer Center Support Programs:   > Cancer Support Group  2nd Tuesday of the month 1pm-2pm, Journey Room

## 2019-03-12 NOTE — Assessment & Plan Note (Signed)
1.  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 -Treated with 6 cycles of R-CHOP from November 09, 2013 through February 22, 2014 nodule. - Physical examination today did not reveal any palpable adenopathy or splenomegaly. -He denies any fevers, night sweats or weight loss in the last 6 months. - We reviewed results of the CT CAP from 02/27/2019.  No adenopathy currently identified.  Normal spleen.  3 liver lesions thought to be hemangiomas.  2 liver lesions consistent with cysts.  Small exophytic lesion of the left kidney upper pole which is technically too small to characterize although could represent a cyst.  Markedly enlarged prostate gland with volume of 210 cm.  There is some asymmetric soft tissue density along the right posterior seminal vesicle. - We have also reviewed his labs.  LDH is within normal limits.  He will come back in 6 months for follow-up.  I have also checked his PSA today which was 3.5.  2.  Hepatitis B surface antigen positive: -Treated with entecavir 0.5 mg daily from March 2015 through August 2015 while he was receiving chemotherapy.  3.  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 -This improved to 50 to 55% in May 2016.  Follows up with Dr. Luiz Ochoa.  4.  Peripheral neuropathy: - He has numbness in the fingertips.  He also has numbness in the feet.  They do not hurt. -She is not on any medication.

## 2019-04-02 ENCOUNTER — Other Ambulatory Visit (HOSPITAL_COMMUNITY): Payer: Medicare Other

## 2019-09-03 ENCOUNTER — Inpatient Hospital Stay (HOSPITAL_COMMUNITY): Payer: Medicare PPO | Attending: Hematology

## 2019-09-03 ENCOUNTER — Other Ambulatory Visit: Payer: Self-pay

## 2019-09-03 DIAGNOSIS — Z9221 Personal history of antineoplastic chemotherapy: Secondary | ICD-10-CM | POA: Insufficient documentation

## 2019-09-03 DIAGNOSIS — C8332 Diffuse large B-cell lymphoma, intrathoracic lymph nodes: Secondary | ICD-10-CM

## 2019-09-03 DIAGNOSIS — C8330 Diffuse large B-cell lymphoma, unspecified site: Secondary | ICD-10-CM | POA: Diagnosis not present

## 2019-09-03 DIAGNOSIS — N400 Enlarged prostate without lower urinary tract symptoms: Secondary | ICD-10-CM | POA: Diagnosis not present

## 2019-09-03 DIAGNOSIS — N189 Chronic kidney disease, unspecified: Secondary | ICD-10-CM | POA: Insufficient documentation

## 2019-09-03 DIAGNOSIS — G629 Polyneuropathy, unspecified: Secondary | ICD-10-CM | POA: Insufficient documentation

## 2019-09-03 DIAGNOSIS — Z79899 Other long term (current) drug therapy: Secondary | ICD-10-CM | POA: Diagnosis not present

## 2019-09-03 DIAGNOSIS — I509 Heart failure, unspecified: Secondary | ICD-10-CM | POA: Diagnosis not present

## 2019-09-03 LAB — CBC WITH DIFFERENTIAL/PLATELET
Abs Immature Granulocytes: 0.01 10*3/uL (ref 0.00–0.07)
Basophils Absolute: 0 10*3/uL (ref 0.0–0.1)
Basophils Relative: 0 %
Eosinophils Absolute: 0.3 10*3/uL (ref 0.0–0.5)
Eosinophils Relative: 4 %
HCT: 51.1 % (ref 39.0–52.0)
Hemoglobin: 16.6 g/dL (ref 13.0–17.0)
Immature Granulocytes: 0 %
Lymphocytes Relative: 29 %
Lymphs Abs: 2.1 10*3/uL (ref 0.7–4.0)
MCH: 28.6 pg (ref 26.0–34.0)
MCHC: 32.5 g/dL (ref 30.0–36.0)
MCV: 88 fL (ref 80.0–100.0)
Monocytes Absolute: 0.7 10*3/uL (ref 0.1–1.0)
Monocytes Relative: 10 %
Neutro Abs: 4 10*3/uL (ref 1.7–7.7)
Neutrophils Relative %: 57 %
Platelets: 195 10*3/uL (ref 150–400)
RBC: 5.81 MIL/uL (ref 4.22–5.81)
RDW: 13.9 % (ref 11.5–15.5)
WBC: 7.2 10*3/uL (ref 4.0–10.5)
nRBC: 0 % (ref 0.0–0.2)

## 2019-09-03 LAB — COMPREHENSIVE METABOLIC PANEL
ALT: 18 U/L (ref 0–44)
AST: 27 U/L (ref 15–41)
Albumin: 4.4 g/dL (ref 3.5–5.0)
Alkaline Phosphatase: 66 U/L (ref 38–126)
Anion gap: 9 (ref 5–15)
BUN: 24 mg/dL — ABNORMAL HIGH (ref 8–23)
CO2: 26 mmol/L (ref 22–32)
Calcium: 9.6 mg/dL (ref 8.9–10.3)
Chloride: 102 mmol/L (ref 98–111)
Creatinine, Ser: 1.36 mg/dL — ABNORMAL HIGH (ref 0.61–1.24)
GFR calc Af Amer: 57 mL/min — ABNORMAL LOW (ref >60)
GFR calc non Af Amer: 49 mL/min — ABNORMAL LOW (ref >60)
Glucose, Bld: 114 mg/dL — ABNORMAL HIGH (ref 70–99)
Potassium: 4.2 mmol/L (ref 3.5–5.1)
Sodium: 137 mmol/L (ref 135–145)
Total Bilirubin: 1 mg/dL (ref 0.3–1.2)
Total Protein: 7 g/dL (ref 6.5–8.1)

## 2019-09-03 LAB — PSA: Prostatic Specific Antigen: 4.9 ng/mL — ABNORMAL HIGH (ref 0.00–4.00)

## 2019-09-03 LAB — LACTATE DEHYDROGENASE: LDH: 184 U/L (ref 98–192)

## 2019-09-10 ENCOUNTER — Other Ambulatory Visit: Payer: Self-pay

## 2019-09-10 ENCOUNTER — Encounter (HOSPITAL_COMMUNITY): Payer: Self-pay | Admitting: Hematology

## 2019-09-10 ENCOUNTER — Inpatient Hospital Stay (HOSPITAL_BASED_OUTPATIENT_CLINIC_OR_DEPARTMENT_OTHER): Payer: Medicare PPO | Admitting: Hematology

## 2019-09-10 VITALS — BP 167/110 | HR 88 | Temp 97.1°F | Resp 18 | Wt 244.0 lb

## 2019-09-10 DIAGNOSIS — C8332 Diffuse large B-cell lymphoma, intrathoracic lymph nodes: Secondary | ICD-10-CM | POA: Diagnosis not present

## 2019-09-10 DIAGNOSIS — C8330 Diffuse large B-cell lymphoma, unspecified site: Secondary | ICD-10-CM | POA: Diagnosis not present

## 2019-09-13 NOTE — Assessment & Plan Note (Signed)
1.  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 -Treated with 6 cycles of R-CHOP from November 09, 2013 through February 22, 2014 nodule. - We reviewed results of the CT CAP from 02/27/2019.  No adenopathy currently identified.  Normal spleen.  3 liver lesions thought to be hemangiomas.  2 liver lesions consistent with cysts.  Small exophytic lesion of the left kidney upper pole which is technically too small to characterize although could represent a cyst.  Markedly enlarged prostate gland with volume of 210 cm.  There is some asymmetric soft tissue density along the right posterior seminal vesicle. -He denies any fevers, night sweats or weight loss in the last 6 months.  Physical exam did not show any palpable adenopathy. -We reviewed labs.  LDH is within normal limits.  I will see him back in 1 year for follow-up.  2.  Hepatitis B surface antigen positive: -Treated with entecavir 0.5 mg daily from March 2015 through August 2015 while he was receiving chemotherapy.  3.  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 -This improved to 50 to 55% in May 2016.  Follows up with Dr. Luiz Ochoa.  4.  Peripheral neuropathy: - He has numbness in the fingertips.  He also has numbness in the feet.  They do not hurt. -She is not on any medication.  5.  Elevated PSA: -His PSA was found to be elevated at 4.9. -He reports having difficulty urinating.  He was recommended to have a TURP procedure by Dr. Lerry Liner.  However he did not have it. -I have told him to follow-up with urology.

## 2019-09-13 NOTE — Progress Notes (Signed)
[place], [place] [address]   CLINIC:  Medical Oncology/Hematology  PCP:  Cathie Olden, [place] [address] [phone]   REASON FOR VISIT: Follow-up for diffuse large B-cell lymphoma.  PREVIOUS THERAPY: RCHOP  CURRENT THERAPY: observation  BRIEF ONCOLOGIC HISTORY:  Oncology History  Diffuse large B cell lymphoma ([place])  10/2013 Initial Diagnosis   Diffuse large B cell lymphoma ([place])   11/09/2013 - 02/22/2014 Chemotherapy   6 cycles of R-CHOP    05/25/2014 Adverse Reaction   Congestive heart failure, anthracycline induced - Echo on 05/25/2014 shows EF of 30 to 35% - Improved EF of 50 to 55% on echo in March 2016   11/15/2017 Imaging   CT scan of the chest, abdomen and pelvis did not show any evidence of adenopathy or recurrence.     INTERVAL HISTORY:  [name] 79 y.o. male seen for follow-up of diffuse large B-cell lymphoma.  Numbness in the hands and feet has been stable.  Denies any fevers, night sweats or weight loss.  Denies any infections or hospitalizations.  Reports difficulty urination which is stable.  REVIEW OF SYSTEMS:  Review of Systems  Genitourinary: Positive for difficulty urinating.   Neurological: Positive for numbness.  Psychiatric/Behavioral: Positive for sleep disturbance.  All other systems reviewed and are negative.    PAST MEDICAL/SURGICAL HISTORY:  Past Medical History:  Diagnosis Date  . CHF (congestive heart failure) ([place])   . Chronic kidney disease   . Hepatitis B surface antigen positive   . Neuropathy due to chemotherapeutic drug [facility])    History reviewed. No pertinent surgical history.   SOCIAL HISTORY:  Social History   Socioeconomic History  . Marital status: Married    Spouse name: Not on file  . Number of children: Not on file  . Years of education: Not on file  . Highest education level: Not on file  Occupational History  . Not on file   Tobacco Use  . Smoking status: Not on file  Substance and Sexual Activity  . Alcohol use: Not on file  . Drug use: Not on file  . Sexual activity: Not on file  Other Topics Concern  . Not on file  Social History Narrative  . Not on file   Social Determinants of Health   Financial Resource Strain:   . Difficulty of Paying Living Expenses: Not on file  Food Insecurity:   . Worried About Charity fundraiser in the Last Year: Not on file  . Ran Out of Food in the Last Year: Not on file  Transportation Needs:   . Lack of Transportation (Medical): Not on file  . Lack of Transportation (Non-Medical): Not on file  Physical Activity:   . Days of Exercise per Week: Not on file  . Minutes of Exercise per Session: Not on file  Stress:   . Feeling of Stress : Not on file  Social Connections:   . Frequency of Communication with Friends and Family: Not on file  . Frequency of Social Gatherings with Friends and Family: Not on file  . Attends Religious Services: Not on file  . Active Member of Clubs or Organizations: Not on file  . Attends Archivist Meetings: Not on file  . Marital Status: Not on file  Intimate Partner Violence:   . Fear of Current or Ex-Partner: Not on file  . Emotionally Abused: Not on file  . Physically Abused: Not on  file  . Sexually Abused: Not on file    FAMILY HISTORY:  History reviewed. No pertinent family history.  CURRENT MEDICATIONS:  Outpatient Encounter Medications as of 09/10/2019  Medication Sig  . carvedilol (COREG) 6.25 MG tablet TAKE 1 TABLET BY MOUTH TWICE DAILY FOR BLOOD PRESSURE  . cholecalciferol (VITAMIN D3) 25 MCG (1000 UNIT) tablet Take 1,000 Units by mouth daily.  . furosemide (LASIX) 40 MG tablet TAKE 1 TABLET EVERY MORNING FOR FLUID  . losartan (COZAAR) 50 MG tablet TAKE 1 TABLET BY MOUTH IN THE MORNING FOR BLOOD PRESSURE  . pantoprazole (PROTONIX) 40 MG tablet Take 40 mg by mouth daily.   . pravastatin (PRAVACHOL) 20 MG  tablet Take 20 mg by mouth daily.   . tamsulosin (FLOMAX) 0.4 MG CAPS capsule Take 0.4 mg by mouth daily.   . vitamin C (ASCORBIC ACID) 500 MG tablet Take 500 mg by mouth daily.   Marland Kitchen zinc gluconate 50 MG tablet Take 50 mg by mouth daily.  . clonazePAM (KLONOPIN) 0.5 MG tablet TAKE 1 TO 2 TABLETS BY MOUTH TWICE A DAY AS NEEDED ANXIETY AND 1 TABLET AT BEDTIME FOR SLEEP   No facility-administered encounter medications on file as of 09/10/2019.    ALLERGIES:  Allergies  Allergen Reactions  . Ace Inhibitors Other (See Comments)    cough   . Atorvastatin Other (See Comments)    Dry mouth     PHYSICAL EXAM:  ECOG Performance status: 1  Vitals:   09/10/19 1132  BP: (!) 167/110  Pulse: 88  Resp: 18  Temp: (!) 97.1 F (36.2 C)  SpO2: 98%   Filed Weights   09/10/19 1132  Weight: 244 lb (110.7 kg)    Physical Exam Constitutional:      Appearance: Normal appearance. He is normal weight.  Cardiovascular:     Rate and Rhythm: Normal rate and regular rhythm.     Heart sounds: Normal heart sounds.  Pulmonary:     Effort: Pulmonary effort is normal.     Breath sounds: Normal breath sounds.  Abdominal:     General: There is no distension.     Palpations: Abdomen is soft. There is no mass.  Musculoskeletal:        General: Normal range of motion.  Lymphadenopathy:     Cervical: No cervical adenopathy.  Skin:    General: Skin is warm and dry.  Neurological:     Mental Status: He is alert and oriented to person, place, and time. Mental status is at baseline.  Psychiatric:        Mood and Affect: Mood normal.        Behavior: Behavior normal.     LABORATORY DATA:  I have reviewed the labs as listed.  CBC    Component Value Date/Time   WBC 7.2 09/03/2019 1100   RBC 5.81 09/03/2019 1100   HGB 16.6 09/03/2019 1100   HCT 51.1 09/03/2019 1100   PLT 195 09/03/2019 1100   MCV 88.0 09/03/2019 1100   MCH 28.6 09/03/2019 1100   MCHC 32.5 09/03/2019 1100   RDW 13.9 09/03/2019  1100   LYMPHSABS 2.1 09/03/2019 1100   MONOABS 0.7 09/03/2019 1100   EOSABS 0.3 09/03/2019 1100   BASOSABS 0.0 09/03/2019 1100   CMP Latest Ref Rng & Units 09/03/2019 02/27/2019 02/27/2019  Glucose 70 - 99 mg/dL 114(H) - 89  BUN 8 - 23 mg/dL 24(H) - 21  Creatinine 0.61 - 1.24 mg/dL 1.36(H) 1.20 1.18  Sodium 135 -  145 mmol/L 137 - 137  Potassium 3.5 - 5.1 mmol/L 4.2 - 4.5  Chloride 98 - 111 mmol/L 102 - 103  CO2 22 - 32 mmol/L 26 - 26  Calcium 8.9 - 10.3 mg/dL 9.6 - 9.2  Total Protein 6.5 - 8.1 g/dL 7.0 - 6.6  Total Bilirubin 0.3 - 1.2 mg/dL 1.0 - 1.2  Alkaline Phos 38 - 126 U/L 66 - 79  AST 15 - 41 U/L 27 - 24  ALT 0 - 44 U/L 18 - 16       DIAGNOSTIC IMAGING:  I have independently reviewed the scans and discussed with the patient.     ASSESSMENT & PLAN:   Diffuse large B cell lymphoma (HCC) 1.  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 -Treated with 6 cycles of R-CHOP from November 09, 2013 through February 22, 2014 nodule. - We reviewed results of the CT CAP from 02/27/2019.  No adenopathy currently identified.  Normal spleen.  3 liver lesions thought to be hemangiomas.  2 liver lesions consistent with cysts.  Small exophytic lesion of the left kidney upper pole which is technically too small to characterize although could represent a cyst.  Markedly enlarged prostate gland with volume of 210 cm.  There is some asymmetric soft tissue density along the right posterior seminal vesicle. -He denies any fevers, night sweats or weight loss in the last 6 months.  Physical exam did not show any palpable adenopathy. -We reviewed labs.  LDH is within normal limits.  I will see him back in 1 year for follow-up.  2.  Hepatitis B surface antigen positive: -Treated with  entecavir 0.5 mg daily from March 2015 through August 2015 while he was receiving chemotherapy.  3.  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 -This improved to 50 to 55% in May 2016.  Follows up with Dr. Luiz Ochoa.  4.  Peripheral neuropathy: - He has numbness in the fingertips.  He also has numbness in the feet.  They do not hurt. -She is not on any medication.  5.  Elevated PSA: -His PSA was found to be elevated at 4.9. -He reports having difficulty urinating.  He was recommended to have a TURP procedure by Dr. Lerry Liner.  However he did not have it. -I have told him to follow-up with urology.      Orders placed this encounter:  Orders Placed This Encounter  Procedures  . Lactate dehydrogenase  . Comprehensive metabolic panel  . CBC with Differential      Derek Jack, MD [place] [phone]

## 2020-09-12 ENCOUNTER — Other Ambulatory Visit (HOSPITAL_COMMUNITY): Payer: Self-pay | Admitting: *Deleted

## 2020-09-12 DIAGNOSIS — N400 Enlarged prostate without lower urinary tract symptoms: Secondary | ICD-10-CM

## 2020-09-12 DIAGNOSIS — C8332 Diffuse large B-cell lymphoma, intrathoracic lymph nodes: Secondary | ICD-10-CM

## 2020-09-13 ENCOUNTER — Inpatient Hospital Stay (HOSPITAL_COMMUNITY): Payer: Medicare PPO

## 2020-09-13 ENCOUNTER — Inpatient Hospital Stay (HOSPITAL_COMMUNITY): Payer: Medicare PPO | Attending: Hematology | Admitting: Hematology

## 2020-09-13 ENCOUNTER — Other Ambulatory Visit: Payer: Self-pay

## 2020-09-13 VITALS — HR 70 | Temp 97.2°F | Resp 20 | Wt 238.8 lb

## 2020-09-13 DIAGNOSIS — Z9221 Personal history of antineoplastic chemotherapy: Secondary | ICD-10-CM | POA: Diagnosis not present

## 2020-09-13 DIAGNOSIS — G629 Polyneuropathy, unspecified: Secondary | ICD-10-CM | POA: Diagnosis not present

## 2020-09-13 DIAGNOSIS — N400 Enlarged prostate without lower urinary tract symptoms: Secondary | ICD-10-CM

## 2020-09-13 DIAGNOSIS — R972 Elevated prostate specific antigen [PSA]: Secondary | ICD-10-CM | POA: Diagnosis not present

## 2020-09-13 DIAGNOSIS — I509 Heart failure, unspecified: Secondary | ICD-10-CM | POA: Insufficient documentation

## 2020-09-13 DIAGNOSIS — Z8572 Personal history of non-Hodgkin lymphomas: Secondary | ICD-10-CM | POA: Insufficient documentation

## 2020-09-13 DIAGNOSIS — C8332 Diffuse large B-cell lymphoma, intrathoracic lymph nodes: Secondary | ICD-10-CM | POA: Diagnosis not present

## 2020-09-13 LAB — CBC WITH DIFFERENTIAL/PLATELET
Abs Immature Granulocytes: 0.03 10*3/uL (ref 0.00–0.07)
Basophils Absolute: 0 10*3/uL (ref 0.0–0.1)
Basophils Relative: 0 %
Eosinophils Absolute: 0.3 10*3/uL (ref 0.0–0.5)
Eosinophils Relative: 4 %
HCT: 49.9 % (ref 39.0–52.0)
Hemoglobin: 16.1 g/dL (ref 13.0–17.0)
Immature Granulocytes: 0 %
Lymphocytes Relative: 19 %
Lymphs Abs: 1.5 10*3/uL (ref 0.7–4.0)
MCH: 29.3 pg (ref 26.0–34.0)
MCHC: 32.3 g/dL (ref 30.0–36.0)
MCV: 90.7 fL (ref 80.0–100.0)
Monocytes Absolute: 0.6 10*3/uL (ref 0.1–1.0)
Monocytes Relative: 8 %
Neutro Abs: 5.4 10*3/uL (ref 1.7–7.7)
Neutrophils Relative %: 69 %
Platelets: 182 10*3/uL (ref 150–400)
RBC: 5.5 MIL/uL (ref 4.22–5.81)
RDW: 13.8 % (ref 11.5–15.5)
WBC: 7.8 10*3/uL (ref 4.0–10.5)
nRBC: 0 % (ref 0.0–0.2)

## 2020-09-13 LAB — COMPREHENSIVE METABOLIC PANEL
ALT: 18 U/L (ref 0–44)
AST: 22 U/L (ref 15–41)
Albumin: 4 g/dL (ref 3.5–5.0)
Alkaline Phosphatase: 79 U/L (ref 38–126)
Anion gap: 5 (ref 5–15)
BUN: 23 mg/dL (ref 8–23)
CO2: 30 mmol/L (ref 22–32)
Calcium: 9.9 mg/dL (ref 8.9–10.3)
Chloride: 105 mmol/L (ref 98–111)
Creatinine, Ser: 1.2 mg/dL (ref 0.61–1.24)
GFR, Estimated: >60 mL/min (ref >60)
Glucose, Bld: 109 mg/dL — ABNORMAL HIGH (ref 70–99)
Potassium: 4.9 mmol/L (ref 3.5–5.1)
Sodium: 140 mmol/L (ref 135–145)
Total Bilirubin: 0.9 mg/dL (ref 0.3–1.2)
Total Protein: 6.9 g/dL (ref 6.5–8.1)

## 2020-09-13 LAB — LACTATE DEHYDROGENASE: LDH: 160 U/L (ref 98–192)

## 2020-09-13 NOTE — Patient Instructions (Signed)
[facility] at [facility] Discharge Instructions  You were seen today by Dr. Katragadda. He went over your recent results. Dr. Katragadda will see you back in 1 year for labs and follow up.   Thank you for choosing [facility] at [facility] to provide your oncology and hematology care.  To afford each patient quality time with our provider, please arrive at least 15 minutes before your scheduled appointment time.   If you have a lab appointment with the Cancer Center please come in thru the Main Entrance and check in at the main information desk  You need to re-schedule your appointment should you arrive 10 or more minutes late.  We strive to give you quality time with our providers, and arriving late affects you and other patients whose appointments are after yours.  Also, if you no show three or more times for appointments you may be dismissed from the clinic at the providers discretion.     Again, thank you for choosing [facility].  Our hope is that these requests will decrease the amount of time that you wait before being seen by our physicians.       _____________________________________________________________  Should you have questions after your visit to [facility], please contact our office at [phone] between the hours of 8:00 a.m. and 4:30 p.m.  Voicemails left after 4:00 p.m. will not be returned until the following business day.  For prescription refill requests, have your pharmacy contact our office and allow 72 hours.    Cancer Center Support Programs:   > Cancer Support Group  2nd Tuesday of the month 1pm-2pm, Journey Room    

## 2020-09-13 NOTE — Progress Notes (Signed)
[place] Christopher Briggs, [place] [address]   CLINIC:  Medical Oncology/Hematology  PCP:  Cathie Olden, MD [address] [facility] [address] / [place] [address]  [phone]  REASON FOR VISIT:  Follow-up for DLBCL  PRIOR THERAPY: R-CHOP x 6 cycles from 11/09/2013 to 02/22/2014  CURRENT THERAPY: Observation  INTERVAL HISTORY:  Christopher Briggs, a 80 y.o. male, returns for routine follow-up for his DLBCL. Christopher Briggs was last seen on 09/10/2019.  Today he reports feeling well. He denies having any new issues in the past year. He reports having numbness in his hands and feet, without pain; he has not been able to clench his hands into fists and had steroid injections which helped. He denies having recent infections, F/C, night sweats, weight loss, or lymphadenopathy. His appetite is excellent. He is taking Flomax and it is helping his stream somewhat.   REVIEW OF SYSTEMS:  Review of Systems  Constitutional: Positive for fatigue. Negative for appetite change, chills, diaphoresis, fever and unexpected weight change.  Respiratory: Positive for shortness of breath.   Genitourinary: Positive for difficulty urinating (on Flomax).   Neurological: Positive for numbness (hands & feet).  Hematological: Negative for adenopathy.  All other systems reviewed and are negative.   PAST MEDICAL/SURGICAL HISTORY:  Past Medical History:  Diagnosis Date  . CHF (congestive heart failure) ([place])   . Chronic kidney disease   . Hepatitis B surface antigen positive   . Neuropathy due to chemotherapeutic drug ([place])    No past surgical history on file.  SOCIAL HISTORY:  Social History   Socioeconomic History  . Marital status: Married    Spouse name: Not on file  . Number of children: Not on file  . Years of education: Not on file  . Highest education level: Not on file  Occupational History  . Not on file  Tobacco Use  . Smoking status: Not on file  . Smokeless  tobacco: Not on file  Substance and Sexual Activity  . Alcohol use: Not on file  . Drug use: Not on file  . Sexual activity: Not on file  Other Topics Concern  . Not on file  Social History Narrative  . Not on file   Social Determinants of Health   Financial Resource Strain: Not on file  Food Insecurity: Not on file  Transportation Needs: Not on file  Physical Activity: Not on file  Stress: Not on file  Social Connections: Not on file  Intimate Partner Violence: Not on file    FAMILY HISTORY:  No family history on file.  CURRENT MEDICATIONS:  Current Outpatient Medications  Medication Sig Dispense Refill  . carvedilol (COREG) 6.25 MG tablet TAKE 1 TABLET BY MOUTH TWICE DAILY FOR BLOOD PRESSURE    . cholecalciferol (VITAMIN D3) 25 MCG (1000 UNIT) tablet Take 1,000 Units by mouth daily.    . clonazePAM (KLONOPIN) 0.5 MG tablet TAKE 1 TO 2 TABLETS BY MOUTH TWICE A DAY AS NEEDED ANXIETY AND 1 TABLET AT BEDTIME FOR SLEEP  0  . furosemide (LASIX) 40 MG tablet TAKE 1 TABLET EVERY MORNING FOR FLUID    . losartan (COZAAR) 50 MG tablet TAKE 1 TABLET BY MOUTH IN THE MORNING FOR BLOOD PRESSURE    . pantoprazole (PROTONIX) 40 MG tablet Take 40 mg by mouth daily.     . pravastatin (PRAVACHOL) 20 MG tablet Take 20 mg by mouth daily.     . tamsulosin (FLOMAX) 0.4 MG CAPS capsule Take 0.4 mg  by mouth daily.     . vitamin C (ASCORBIC ACID) 500 MG tablet Take 500 mg by mouth daily.     Marland Kitchen zinc gluconate 50 MG tablet Take 50 mg by mouth daily.     No current facility-administered medications for this visit.    ALLERGIES:  Allergies  Allergen Reactions  . Ace Inhibitors Other (See Comments)    cough   . Atorvastatin Other (See Comments)    Dry mouth    PHYSICAL EXAM:  Performance status (ECOG): 1 - Symptomatic but completely ambulatory  Vitals:   09/13/20 1100  Pulse: 70  Resp: 20  Temp: (!) 97.2 F (36.2 C)  SpO2: 99%   Wt Readings from Last 3 Encounters:  09/13/20 238 lb  12.1 oz (108.3 kg)  09/10/19 244 lb (110.7 kg)  03/05/19 232 lb 12.8 oz (105.6 kg)   Physical Exam Vitals reviewed.  Constitutional:      Appearance: Normal appearance.  Cardiovascular:     Rate and Rhythm: Normal rate and regular rhythm.     Pulses: Normal pulses.     Heart sounds: Normal heart sounds.  Pulmonary:     Effort: Pulmonary effort is normal.     Breath sounds: Normal breath sounds.  Chest:  Breasts:     Right: No axillary adenopathy or supraclavicular adenopathy.     Left: No axillary adenopathy or supraclavicular adenopathy.    Abdominal:     Palpations: Abdomen is soft. There is no hepatomegaly, splenomegaly or mass.     Tenderness: There is no abdominal tenderness.     Hernia: No hernia is present.  Musculoskeletal:     Right lower leg: No edema.     Left lower leg: No edema.  Lymphadenopathy:     Cervical: No cervical adenopathy.     Upper Body:     Right upper body: No supraclavicular, axillary, pectoral or epitrochlear adenopathy.     Left upper body: No supraclavicular, axillary, pectoral or epitrochlear adenopathy.     Lower Body: No right inguinal adenopathy. No left inguinal adenopathy.  Neurological:     General: No focal deficit present.     Mental Status: He is alert and oriented to person, place, and time.  Psychiatric:        Mood and Affect: Mood normal.        Behavior: Behavior normal.     LABORATORY DATA:  I have reviewed the labs as listed.  CBC Latest Ref Rng & Units 09/13/2020 09/03/2019 02/27/2019  WBC 4.0 - 10.5 K/uL 7.8 7.2 6.5  Hemoglobin 13.0 - 17.0 g/dL 64.6 80.3 21.2  Hematocrit 39.0 - 52.0 % 49.9 51.1 50.9  Platelets 150 - 400 K/uL 182 195 202   CMP Latest Ref Rng & Units 09/13/2020 09/03/2019 02/27/2019  Glucose 70 - 99 mg/dL 248(G) 500(B) -  BUN 8 - 23 mg/dL 23 70(W) -  Creatinine 0.61 - 1.24 mg/dL 8.88 9.16(X) 4.50  Sodium 135 - 145 mmol/L 140 137 -  Potassium 3.5 - 5.1 mmol/L 4.9 4.2 -  Chloride 98 - 111 mmol/L 105 102 -   CO2 22 - 32 mmol/L 30 26 -  Calcium 8.9 - 10.3 mg/dL 9.9 9.6 -  Total Protein 6.5 - 8.1 g/dL 6.9 7.0 -  Total Bilirubin 0.3 - 1.2 mg/dL 0.9 1.0 -  Alkaline Phos 38 - 126 U/L 79 66 -  AST 15 - 41 U/L 22 27 -  ALT 0 - 44 U/L 18 18 -  Component Value Date/Time   RBC 5.50 09/13/2020 1002   MCV 90.7 09/13/2020 1002   MCH 29.3 09/13/2020 1002   MCHC 32.3 09/13/2020 1002   RDW 13.8 09/13/2020 1002   LYMPHSABS 1.5 09/13/2020 1002   MONOABS 0.6 09/13/2020 1002   EOSABS 0.3 09/13/2020 1002   BASOSABS 0.0 09/13/2020 1002   Lab Results  Component Value Date   LDH 160 09/13/2020   LDH 184 09/03/2019   LDH 180 02/27/2019    DIAGNOSTIC IMAGING:  I have independently reviewed the scans and discussed with the patient. No results found.   ASSESSMENT:  1.  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 -Treated with 6 cycles of R-CHOP from November 09, 2013 through February 22, 2014 nodule. -CT CAP on 02/27/2019 with  no adenopathy identified. Normal spleen.  3 liver lesions thought to be hemangiomas.  2 liver lesions consistent with cysts.  Small exophytic lesion of the left kidney upper pole which is technically too small to characterize although could represent a cyst.  Markedly enlarged prostate gland with volume of 210 cm.  There is some asymmetric soft tissue density along the right posterior seminal vesicle.  2.  Hepatitis B surface antigen positive: -Treated with entecavir 0.5 mg daily from March 2015 through August 2015 while he was receiving chemotherapy.  3.  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 -This improved to 50 to 55% in May 2016.  Follows up with Dr. Luiz Ochoa.   PLAN:  1.  Stage IVb diffuse large B-cell lymphoma  with bone involvement, mediastinal and right axillary lymphadenopathy: -She does not have any fevers, night sweats or weight loss.  No right upper extremity swelling. -LDH is normal.  No palpable lymphadenopathy or hepatosplenomegaly. -RTC 1 year for follow-up with physical exam and labs.  2.  Peripheral neuropathy: -He continues to have numbness in the fingertips and feet.  He does not require any pharmacological intervention.  3.  Elevated PSA: -He follows with Dr. Harlow Asa in [place].   Orders placed this encounter:  Orders Placed This Encounter  Procedures  . CBC with Differential/Platelet  . Comprehensive metabolic panel  . Lactate dehydrogenase     Derek Jack, MD [place] [phone]   I, Milinda Antis, am acting as a scribe for Dr. Sanda Linger.  I, Derek Jack MD, have reviewed the above documentation for accuracy and completeness, and I agree with the above.

## 2020-11-30 IMAGING — CT CT CHEST WITH CONTRAST
2 of 5 series · 12 of 36 positions shown, 15 images · IV contrast (Isovue)
Comparison: Report from CT chest, abdomen, and pelvis from [REDACTED] in [HOSPITAL], [place] dated 11/15/2017

CLINICAL DATA: Diffuse large B-cell lymphoma

EXAM:
CT CHEST, ABDOMEN, AND PELVIS WITH CONTRAST
TECHNIQUE: Multidetector CT imaging of the chest, abdomen and pelvis was
performed following the standard protocol during bolus
administration of intravenous contrast.
CONTRAST:  100mL OMNIPAQUE IOHEXOL 350 MG/ML SOLN

[Series 2: cap with · axial · 0.79mm/px · z∈[+1009,+1524]mm · 9 of 129 slices shown, 12 images]
[im 13/129  mediastinal]
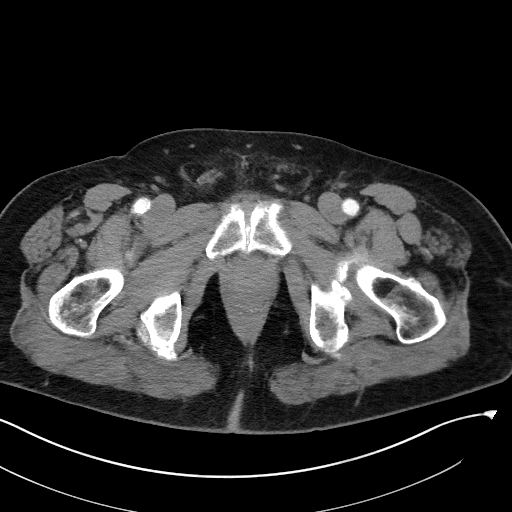
[im 13/129  lung]
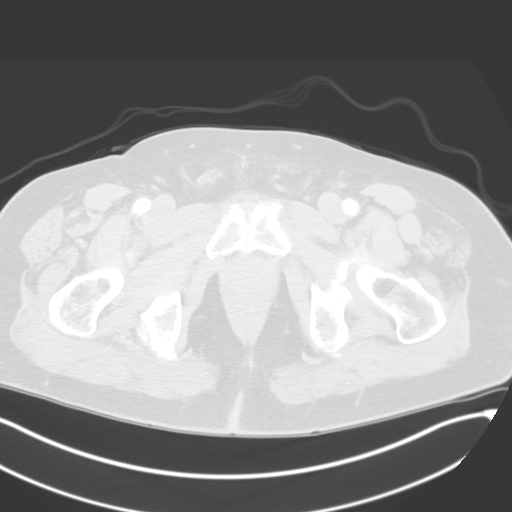
[im 26/129  lung]
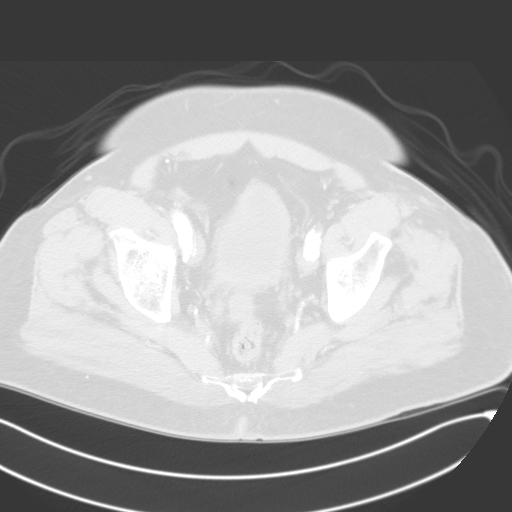
[im 39/129  lung]
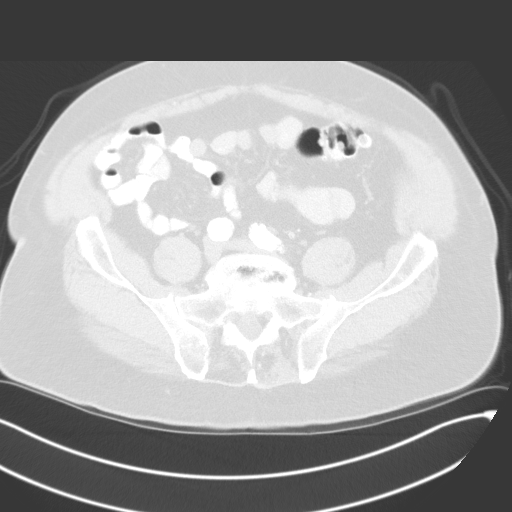
[im 52/129  lung]
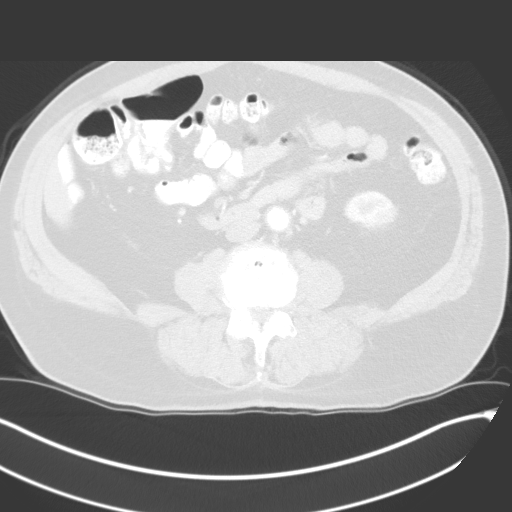
[im 65/129  mediastinal]
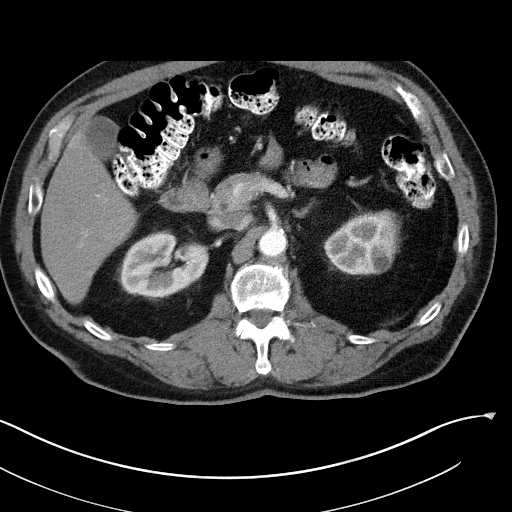
[im 65/129  lung]
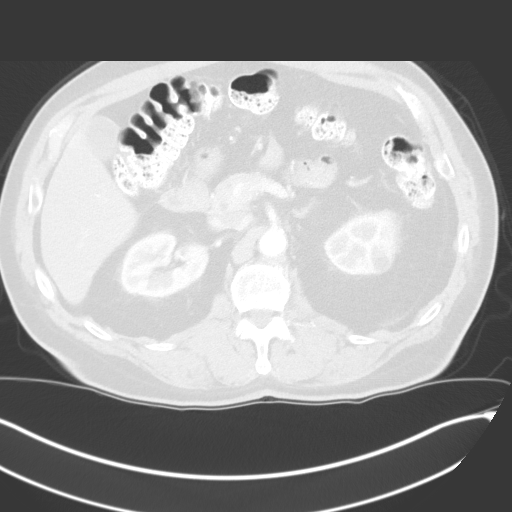
[im 77/129  lung]
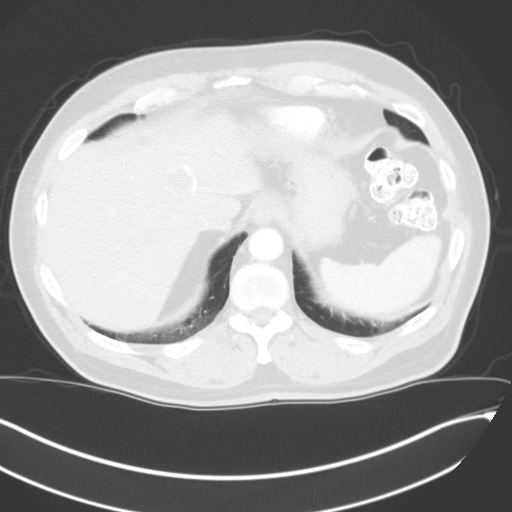
[im 90/129  lung]
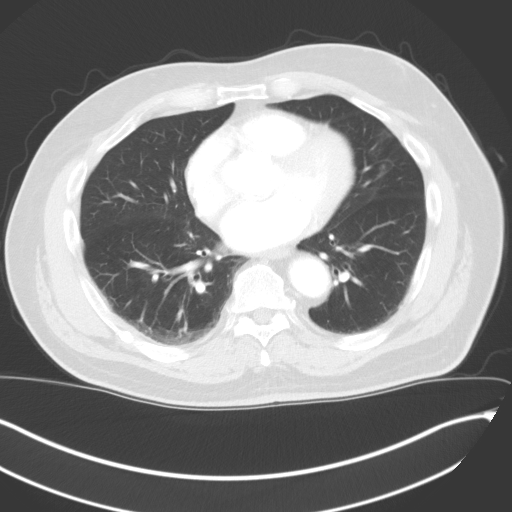
[im 103/129  lung]
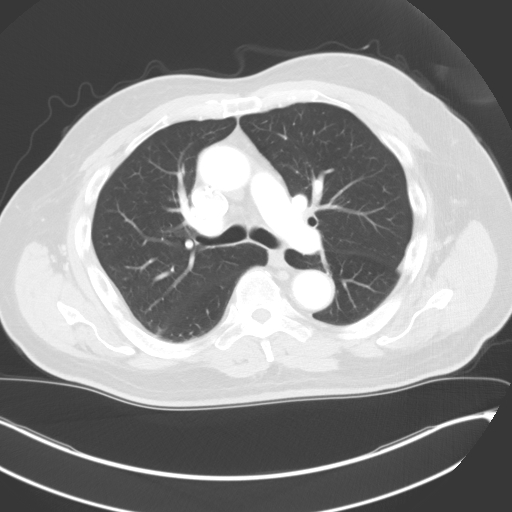
[im 116/129  mediastinal]
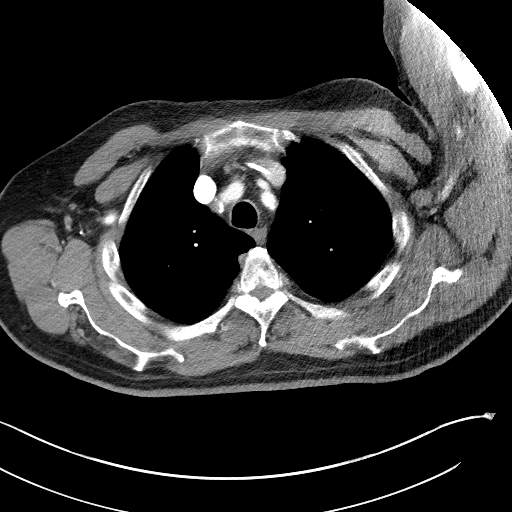
[im 116/129  lung]
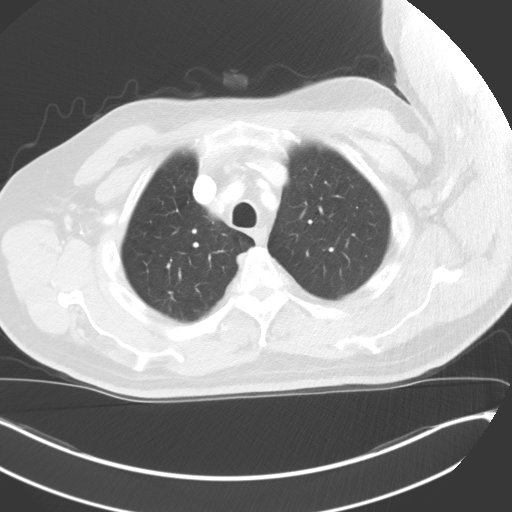

[Series 4: coronals · coronal · 0.82mm/px · 3 of 144 slices shown]
[im 29/144  lung]
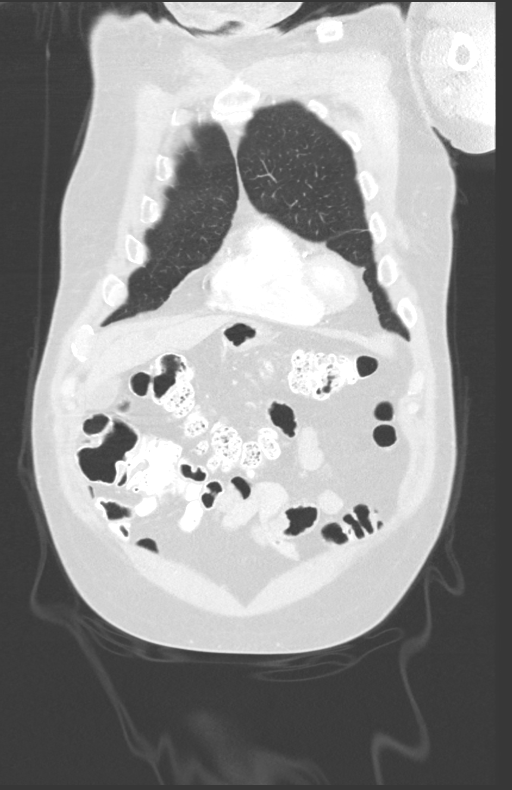
[im 58/144  lung]
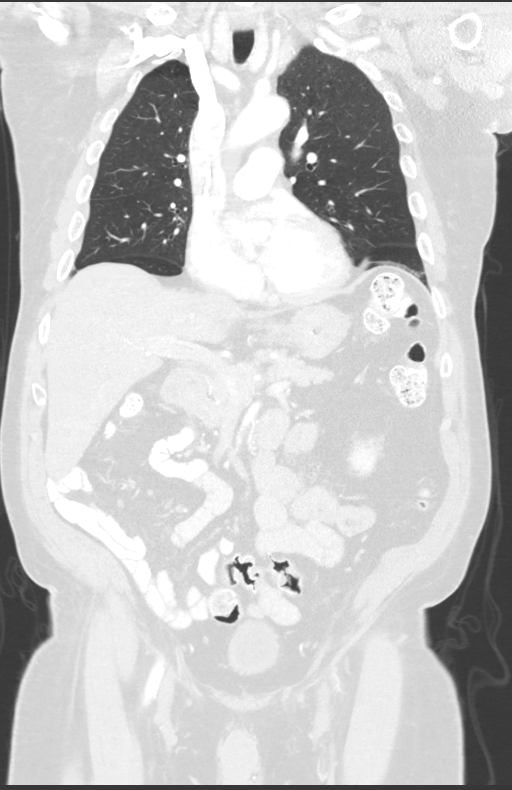
[im 86/144  lung]
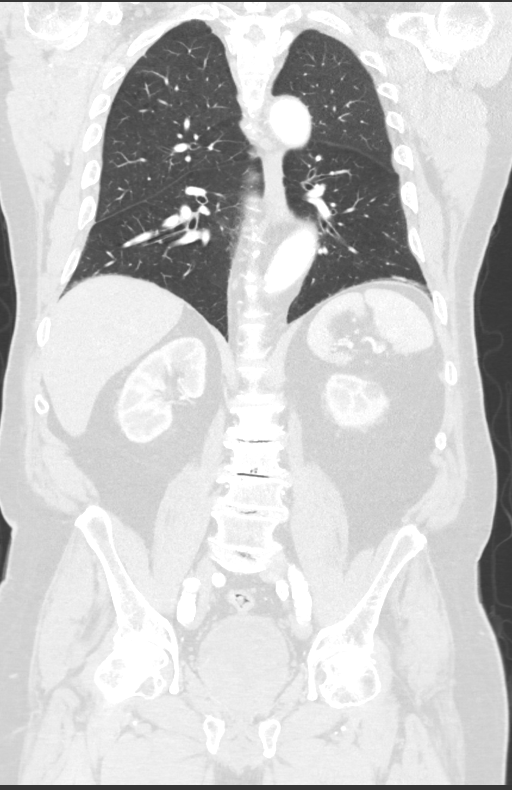

[12 of 36 positions shown; findings below may reference images not displayed]

FINDINGS: CT CHEST FINDINGS

Cardiovascular: Left Port-A-Cath tip: SVC. Coronary, aortic arch,
and branch vessel atherosclerotic vascular disease.

Mediastinum/Nodes: No pathologic adenopathy is observed in the
chest.

Lungs/Pleura: Linear subsegmental atelectasis or scarring in the
lingula.

Musculoskeletal: Degenerative glenohumeral arthropathy bilaterally.

CT ABDOMEN PELVIS FINDINGS

Hepatobiliary: A 3.1 by 2.1 by 2.4 cm lesion in the left hepatic
lobe demonstrating peripheral nodular discontinuous enhancement on
portal venous phase images and near complete enhancement on delayed
images is probably a hepatic hemangioma.

A 1.5 by 1.0 cm hypodense lesion in the left hepatic lobe on image
[DATE] is not definitively enhancing and probably a cyst, although
technically too small to characterize.

Nonspecific 0.6 cm hypodense lesion posteriorly in the right hepatic
lobe on image [DATE].

Gallbladder unremarkable.

Pancreas: Unremarkable

Spleen: Unremarkable.  No splenomegaly.

Adrenals/Urinary Tract: 1.3 by 1.2 by 1.0 cm hypodense lesion of the
left kidney upper pole posterolaterally is technically too small to
characterize, although statistically likely to be a cyst. The
enlarged prostate indents the bladder base.

Stomach/Bowel: Unremarkable

Vascular/Lymphatic: Aortoiliac atherosclerotic vascular disease. No
pathologic adenopathy observed.

Reproductive: The prostate gland measures 7.6 by 6.6 by 8.0 cm
(volume = 210 cm^3), markedly enlarged. Moreover, there is soft
tissue density involving the right medial portion of the seminal
vesicle in an asymmetric fashion.

Other: No supplemental non-categorized findings.

Musculoskeletal: Posterior decompression at L4 and L5. Spondylosis
and degenerative disc disease at all lumbar levels with foraminal
impingement on the right at L2-3, L3-4, L4-5, and L5-S1; and on the
left at L3-4, L4-5, and L5-S1. There is likely central narrowing of
the thecal sac at L1-2 and L2-3.
IMPRESSION: 1. Markedly enlarged prostate gland with volume of 210 cubic cm,
with some asymmetric soft tissue density along the right posterior
seminal vesicle. Prostate cancer invading the right seminal vesicle
is not excluded. Correlate with PSA levels and prostate history.
2. No adenopathy is currently identified.  Normal appearing spleen.
3. There are 3 liver lesions, one of which is thought to highly
likely be a hemangioma. The other 2 lesions are probably small cysts
although technically nonspecific due to small size.
4. There is also a small exophytic lesion of the left kidney upper
pole which is technically too small to characterize although
statistically likely to be a cyst.
5. Other imaging findings of potential clinical significance: Aortic
Atherosclerosis ([id number].D). Degenerative glenohumeral arthropathy
bilaterally. Lumbar impingement at all levels, with prior posterior
decompression at L4 and L5.

## 2021-09-19 ENCOUNTER — Other Ambulatory Visit (HOSPITAL_COMMUNITY): Payer: Self-pay

## 2021-09-19 DIAGNOSIS — C8332 Diffuse large B-cell lymphoma, intrathoracic lymph nodes: Secondary | ICD-10-CM

## 2021-09-20 ENCOUNTER — Other Ambulatory Visit (HOSPITAL_COMMUNITY): Payer: Self-pay | Admitting: *Deleted

## 2021-09-20 ENCOUNTER — Inpatient Hospital Stay (HOSPITAL_BASED_OUTPATIENT_CLINIC_OR_DEPARTMENT_OTHER): Payer: Medicare PPO | Admitting: Hematology

## 2021-09-20 ENCOUNTER — Inpatient Hospital Stay (HOSPITAL_COMMUNITY): Payer: Medicare PPO | Attending: Hematology

## 2021-09-20 ENCOUNTER — Other Ambulatory Visit: Payer: Self-pay

## 2021-09-20 VITALS — BP 135/86 | HR 69 | Temp 97.8°F | Resp 18 | Ht 73.0 in | Wt 234.3 lb

## 2021-09-20 DIAGNOSIS — G629 Polyneuropathy, unspecified: Secondary | ICD-10-CM | POA: Insufficient documentation

## 2021-09-20 DIAGNOSIS — N400 Enlarged prostate without lower urinary tract symptoms: Secondary | ICD-10-CM | POA: Diagnosis not present

## 2021-09-20 DIAGNOSIS — C8332 Diffuse large B-cell lymphoma, intrathoracic lymph nodes: Secondary | ICD-10-CM

## 2021-09-20 DIAGNOSIS — Z8572 Personal history of non-Hodgkin lymphomas: Secondary | ICD-10-CM | POA: Diagnosis present

## 2021-09-20 DIAGNOSIS — I509 Heart failure, unspecified: Secondary | ICD-10-CM | POA: Insufficient documentation

## 2021-09-20 LAB — CBC WITH DIFFERENTIAL/PLATELET
Abs Immature Granulocytes: 0.02 10*3/uL (ref 0.00–0.07)
Basophils Absolute: 0 10*3/uL (ref 0.0–0.1)
Basophils Relative: 1 %
Eosinophils Absolute: 0.3 10*3/uL (ref 0.0–0.5)
Eosinophils Relative: 4 %
HCT: 51.5 % (ref 39.0–52.0)
Hemoglobin: 16.2 g/dL (ref 13.0–17.0)
Immature Granulocytes: 0 %
Lymphocytes Relative: 17 %
Lymphs Abs: 1.3 10*3/uL (ref 0.7–4.0)
MCH: 28.2 pg (ref 26.0–34.0)
MCHC: 31.5 g/dL (ref 30.0–36.0)
MCV: 89.6 fL (ref 80.0–100.0)
Monocytes Absolute: 0.6 10*3/uL (ref 0.1–1.0)
Monocytes Relative: 8 %
Neutro Abs: 5.4 10*3/uL (ref 1.7–7.7)
Neutrophils Relative %: 70 %
Platelets: 185 10*3/uL (ref 150–400)
RBC: 5.75 MIL/uL (ref 4.22–5.81)
RDW: 12.4 % (ref 11.5–15.5)
WBC: 7.7 10*3/uL (ref 4.0–10.5)
nRBC: 0 % (ref 0.0–0.2)

## 2021-09-20 LAB — COMPREHENSIVE METABOLIC PANEL
ALT: 12 U/L (ref 0–44)
AST: 20 U/L (ref 15–41)
Albumin: 4.1 g/dL (ref 3.5–5.0)
Alkaline Phosphatase: 74 U/L (ref 38–126)
Anion gap: 6 (ref 5–15)
BUN: 27 mg/dL — ABNORMAL HIGH (ref 8–23)
CO2: 24 mmol/L (ref 22–32)
Calcium: 9.4 mg/dL (ref 8.9–10.3)
Chloride: 105 mmol/L (ref 98–111)
Creatinine, Ser: 1.15 mg/dL (ref 0.61–1.24)
GFR, Estimated: >60 mL/min (ref >60)
Glucose, Bld: 113 mg/dL — ABNORMAL HIGH (ref 70–99)
Potassium: 4.4 mmol/L (ref 3.5–5.1)
Sodium: 135 mmol/L (ref 135–145)
Total Bilirubin: 0.9 mg/dL (ref 0.3–1.2)
Total Protein: 6.7 g/dL (ref 6.5–8.1)

## 2021-09-20 LAB — LACTATE DEHYDROGENASE: LDH: 147 U/L (ref 98–192)

## 2021-09-20 NOTE — Patient Instructions (Signed)
[place] at [facility] Discharge Instructions   You were seen and examined today by Dr. Delton Coombes.  He reviewed your lab results which are normal/stable.  Return as scheduled in 1 year for lab work and office visit.    Thank you for choosing [place] at [facility] to provide your oncology and hematology care.  To afford each patient quality time with our provider, please arrive at least 15 minutes before your scheduled appointment time.   If you have a lab appointment with the [facility] please come in thru the Main Entrance and check in at the main information desk.  You need to re-schedule your appointment should you arrive 10 or more minutes late.  We strive to give you quality time with our providers, and arriving late affects you and other patients whose appointments are after yours.  Also, if you no show three or more times for appointments you may be dismissed from the clinic at the providers discretion.     Again, thank you for choosing [facility].  Our hope is that these requests will decrease the amount of time that you wait before being seen by our physicians.       _____________________________________________________________  Should you have questions after your visit to [facility], please contact our office at [phone] and follow the prompts.  Our office hours are 8:00 a.m. and 4:30 p.m. Monday - Friday.  Please note that voicemails left after 4:00 p.m. may not be returned until the following business day.  We are closed weekends and major holidays.  You do have access to a nurse 24-7, just call the main number to the clinic [phone] and do not press any options, hold on the line and a nurse will answer the phone.    For prescription refill requests, have your pharmacy contact our office and allow 72 hours.    Due to Covid, you will need to wear a mask upon entering the hospital. If you  do not have a mask, a mask will be given to you at the Main Entrance upon arrival. For doctor visits, patients may have 1 support person age 33 or older with them. For treatment visits, patients can not have anyone with them due to social distancing guidelines and our immunocompromised population.

## 2021-09-20 NOTE — Progress Notes (Signed)
[name], [place] [address]   CLINIC:  Medical Oncology/Hematology  PCP:  Cathie Olden, MD [address] [facility] [address] / [place] [address] [phone]   REASON FOR VISIT:  Follow-up for DLBCL  PRIOR THERAPY: R-CHOP x 6 cycles from 11/09/2013 to 02/22/2014  CURRENT THERAPY: surveillance  BRIEF ONCOLOGIC HISTORY:  Oncology History  Diffuse large B cell lymphoma ([place])  10/2013 Initial Diagnosis   Diffuse large B cell lymphoma ([place])   11/09/2013 - 02/22/2014 Chemotherapy   6 cycles of R-CHOP    05/25/2014 Adverse Reaction   Congestive heart failure, anthracycline induced - Echo on 05/25/2014 shows EF of 30 to 35% - Improved EF of 50 to 55% on echo in March 2016   11/15/2017 Imaging   CT scan of the chest, abdomen and pelvis did not show any evidence of adenopathy or recurrence.     CANCER STAGING:  Cancer Staging  No matching staging information was found for the patient.  INTERVAL HISTORY:  [name], a 81 y.o. male, returns for routine follow-up of his DLBCL. Christopher Briggs was last seen on 09/13/2020.   Today he reports feeling good. He reports stable tingling in his feet. He denies fevers, night sweats, weight loss, and recent infections. He reports stable SOB.   REVIEW OF SYSTEMS:  Review of Systems  Constitutional:  Negative for appetite change, fatigue, fever and unexpected weight change.  Respiratory:  Positive for cough and shortness of breath.   Endocrine: Negative for hot flashes.  Musculoskeletal:  Positive for arthralgias (5/10).  Neurological:  Positive for numbness.  All other systems reviewed and are negative.  PAST MEDICAL/SURGICAL HISTORY:  Past Medical History:  Diagnosis Date   CHF (congestive heart failure) ([facility])    Chronic kidney disease    Hepatitis B surface antigen positive    Neuropathy due to chemotherapeutic drug ([place])    No past surgical history on file.  SOCIAL HISTORY:  Social  History   Socioeconomic History   Marital status: Married    Spouse name: Not on file   Number of children: Not on file   Years of education: Not on file   Highest education level: Not on file  Occupational History   Not on file  Tobacco Use   Smoking status: Not on file   Smokeless tobacco: Not on file  Substance and Sexual Activity   Alcohol use: Not on file   Drug use: Not on file   Sexual activity: Not on file  Other Topics Concern   Not on file  Social History Narrative   Not on file   Social Determinants of Health   Financial Resource Strain: Not on file  Food Insecurity: Not on file  Transportation Needs: Not on file  Physical Activity: Not on file  Stress: Not on file  Social Connections: Not on file  Intimate Partner Violence: Not on file    FAMILY HISTORY:  No family history on file.  CURRENT MEDICATIONS:  Current Outpatient Medications  Medication Sig Dispense Refill   pravastatin (PRAVACHOL) 40 MG tablet TAKE 1 TABLET EVERY NIGHT FOR CHOLESTEROL     carvedilol (COREG) 6.25 MG tablet TAKE 1 TABLET BY MOUTH TWICE DAILY FOR BLOOD PRESSURE     cholecalciferol (VITAMIN D3) 25 MCG (1000 UNIT) tablet Take 1,000 Units by mouth daily.     clonazePAM (KLONOPIN) 0.5 MG tablet TAKE 1 TO 2 TABLETS BY MOUTH TWICE A DAY AS NEEDED ANXIETY AND 1 TABLET AT  BEDTIME FOR SLEEP  0   furosemide (LASIX) 40 MG tablet TAKE 1 TABLET EVERY MORNING FOR FLUID     losartan (COZAAR) 100 MG tablet Take 100 mg by mouth daily.     pantoprazole (PROTONIX) 40 MG tablet Take 40 mg by mouth daily.      tamsulosin (FLOMAX) 0.4 MG CAPS capsule Take 0.4 mg by mouth daily.      terbinafine (LAMISIL) 250 MG tablet terbinafine HCl 250 mg tablet  TAKE 1 TABLET BY MOUTH ONCE DAILY     vitamin C (ASCORBIC ACID) 500 MG tablet Take 500 mg by mouth daily.      zinc gluconate 50 MG tablet Take 50 mg by mouth daily.     No current facility-administered medications for this visit.    ALLERGIES:   Allergies  Allergen Reactions   Ace Inhibitors Other (See Comments)    cough    Atorvastatin Other (See Comments)    Dry mouth    PHYSICAL EXAM:  Performance status (ECOG): 1 - Symptomatic but completely ambulatory  Vitals:   09/20/21 1211  BP: 135/86  Pulse: 69  Resp: 18  Temp: 97.8 F (36.6 C)  SpO2: 98%   Wt Readings from Last 3 Encounters:  09/20/21 234 lb 4.8 oz (106.3 kg)  09/13/20 238 lb 12.1 oz (108.3 kg)  09/10/19 244 lb (110.7 kg)   Physical Exam Vitals reviewed.  Constitutional:      Appearance: Normal appearance.  Cardiovascular:     Rate and Rhythm: Normal rate and regular rhythm.     Pulses: Normal pulses.     Heart sounds: Normal heart sounds.  Pulmonary:     Effort: Pulmonary effort is normal.     Breath sounds: Normal breath sounds.  Abdominal:     Palpations: Abdomen is soft. There is no hepatomegaly, splenomegaly or mass.     Tenderness: There is no abdominal tenderness.  Musculoskeletal:     Right upper arm: No swelling.     Left upper arm: No swelling.     Right lower leg: No edema.     Left lower leg: No edema.  Lymphadenopathy:     Cervical: No cervical adenopathy.     Right cervical: No superficial cervical adenopathy.    Left cervical: No superficial cervical adenopathy.     Upper Body:     Right upper body: No supraclavicular or axillary adenopathy.     Left upper body: No supraclavicular or axillary adenopathy.     Lower Body: No right inguinal adenopathy. No left inguinal adenopathy.  Neurological:     General: No focal deficit present.     Mental Status: He is alert and oriented to person, place, and time.  Psychiatric:        Mood and Affect: Mood normal.        Behavior: Behavior normal.     LABORATORY DATA:  I have reviewed the labs as listed.  CBC Latest Ref Rng & Units 09/20/2021 09/13/2020 09/03/2019  WBC 4.0 - 10.5 K/uL 7.7 7.8 7.2  Hemoglobin 13.0 - 17.0 g/dL 16.2 16.1 16.6  Hematocrit 39.0 - 52.0 % 51.5 49.9 51.1   Platelets 150 - 400 K/uL 185 182 195   CMP Latest Ref Rng & Units 09/20/2021 09/13/2020 09/03/2019  Glucose 70 - 99 mg/dL 113(H) 109(H) 114(H)  BUN 8 - 23 mg/dL 27(H) 23 24(H)  Creatinine 0.61 - 1.24 mg/dL 1.15 1.20 1.36(H)  Sodium 135 - 145 mmol/L 135 140 137  Potassium  3.5 - 5.1 mmol/L 4.4 4.9 4.2  Chloride 98 - 111 mmol/L 105 105 102  CO2 22 - 32 mmol/L 24 30 26   Calcium 8.9 - 10.3 mg/dL 9.4 9.9 9.6  Total Protein 6.5 - 8.1 g/dL 6.7 6.9 7.0  Total Bilirubin 0.3 - 1.2 mg/dL 0.9 0.9 1.0  Alkaline Phos 38 - 126 U/L 74 79 66  AST 15 - 41 U/L 20 22 27   ALT 0 - 44 U/L 12 18 18     DIAGNOSTIC IMAGING:  I have independently reviewed the scans and discussed with the patient. No results found.   ASSESSMENT:  1.  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 -Treated with 6 cycles of R-CHOP from November 09, 2013 through February 22, 2014 nodule. -CT CAP on 02/27/2019 with  no adenopathy identified. Normal spleen.  3 liver lesions thought to be hemangiomas.  2 liver lesions consistent with cysts.  Small exophytic lesion of the left kidney upper pole which is technically too small to characterize although could represent a cyst.  Markedly enlarged prostate gland with volume of 210 cm.  There is some asymmetric soft tissue density along the right posterior seminal vesicle.   2.  Hepatitis B surface antigen positive: -Treated with entecavir 0.5 mg daily from March 2015 through August 2015 while he was receiving chemotherapy.   3.  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 -This improved to 50 to 55% in May 2016.  Follows up with Dr. Luiz Ochoa.   PLAN:  1.  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  No right upper extremity swelling. - Physical examination does not show any palpable adenopathy or splenomegaly. - We have reviewed labs today which showed normal LFTs and LDH.  CBC was grossly normal. - No evidence of lymphoma recurrence at this time.  Continue follow-up in 1 year with labs on same day.   2.  Peripheral neuropathy: - He has some numbness in the fingertips and feet which does not require any pharmacological intervention.   3.  Elevated PSA: - Continue follow-up with Dr. Harlow Asa in [employer].   Orders placed this encounter:  No orders of the defined types were placed in this encounter.    Derek Jack, MD [place] [phone]   I, Thana Ates, am acting as a scribe for Dr. Derek Jack.  I, Derek Jack MD, have reviewed the above documentation for accuracy and completeness, and I agree with the above.

## 2021-09-27 ENCOUNTER — Ambulatory Visit (HOSPITAL_COMMUNITY): Payer: Medicare PPO | Admitting: Hematology

## 2022-09-19 ENCOUNTER — Inpatient Hospital Stay: Payer: Medicare PPO

## 2022-09-19 ENCOUNTER — Inpatient Hospital Stay: Payer: Medicare PPO | Attending: Hematology | Admitting: Hematology

## 2022-10-31 ENCOUNTER — Other Ambulatory Visit: Payer: Self-pay

## 2022-10-31 DIAGNOSIS — C8332 Diffuse large B-cell lymphoma, intrathoracic lymph nodes: Secondary | ICD-10-CM

## 2022-11-01 ENCOUNTER — Encounter: Payer: Self-pay | Admitting: Hematology

## 2022-11-01 ENCOUNTER — Inpatient Hospital Stay: Payer: Medicare PPO

## 2022-11-01 ENCOUNTER — Inpatient Hospital Stay: Payer: Medicare PPO | Attending: Hematology | Admitting: Hematology

## 2022-11-01 VITALS — BP 154/97 | HR 86 | Temp 97.8°F | Resp 18 | Wt 234.1 lb

## 2022-11-01 DIAGNOSIS — Z9221 Personal history of antineoplastic chemotherapy: Secondary | ICD-10-CM | POA: Diagnosis not present

## 2022-11-01 DIAGNOSIS — C8338 Diffuse large B-cell lymphoma, lymph nodes of multiple sites: Secondary | ICD-10-CM | POA: Diagnosis present

## 2022-11-01 DIAGNOSIS — C8332 Diffuse large B-cell lymphoma, intrathoracic lymph nodes: Secondary | ICD-10-CM

## 2022-11-01 DIAGNOSIS — R972 Elevated prostate specific antigen [PSA]: Secondary | ICD-10-CM | POA: Diagnosis not present

## 2022-11-01 DIAGNOSIS — I509 Heart failure, unspecified: Secondary | ICD-10-CM | POA: Diagnosis not present

## 2022-11-01 DIAGNOSIS — G629 Polyneuropathy, unspecified: Secondary | ICD-10-CM | POA: Insufficient documentation

## 2022-11-01 LAB — CBC WITH DIFFERENTIAL/PLATELET
Abs Immature Granulocytes: 0.02 10*3/uL (ref 0.00–0.07)
Basophils Absolute: 0 10*3/uL (ref 0.0–0.1)
Basophils Relative: 0 %
Eosinophils Absolute: 0.2 10*3/uL (ref 0.0–0.5)
Eosinophils Relative: 3 %
HCT: 49.9 % (ref 39.0–52.0)
Hemoglobin: 16.4 g/dL (ref 13.0–17.0)
Immature Granulocytes: 0 %
Lymphocytes Relative: 20 %
Lymphs Abs: 1.6 10*3/uL (ref 0.7–4.0)
MCH: 28.8 pg (ref 26.0–34.0)
MCHC: 32.9 g/dL (ref 30.0–36.0)
MCV: 87.5 fL (ref 80.0–100.0)
Monocytes Absolute: 0.7 10*3/uL (ref 0.1–1.0)
Monocytes Relative: 8 %
Neutro Abs: 5.4 10*3/uL (ref 1.7–7.7)
Neutrophils Relative %: 69 %
Platelets: 169 10*3/uL (ref 150–400)
RBC: 5.7 MIL/uL (ref 4.22–5.81)
RDW: 13.5 % (ref 11.5–15.5)
WBC: 7.9 10*3/uL (ref 4.0–10.5)
nRBC: 0 % (ref 0.0–0.2)

## 2022-11-01 LAB — COMPREHENSIVE METABOLIC PANEL
ALT: 21 U/L (ref 0–44)
AST: 27 U/L (ref 15–41)
Albumin: 4 g/dL (ref 3.5–5.0)
Alkaline Phosphatase: 81 U/L (ref 38–126)
Anion gap: 9 (ref 5–15)
BUN: 27 mg/dL — ABNORMAL HIGH (ref 8–23)
CO2: 22 mmol/L (ref 22–32)
Calcium: 9.1 mg/dL (ref 8.9–10.3)
Chloride: 105 mmol/L (ref 98–111)
Creatinine, Ser: 1.31 mg/dL — ABNORMAL HIGH (ref 0.61–1.24)
GFR, Estimated: 54 mL/min — ABNORMAL LOW (ref >60)
Glucose, Bld: 125 mg/dL — ABNORMAL HIGH (ref 70–99)
Potassium: 3.7 mmol/L (ref 3.5–5.1)
Sodium: 136 mmol/L (ref 135–145)
Total Bilirubin: 1.2 mg/dL (ref 0.3–1.2)
Total Protein: 6.7 g/dL (ref 6.5–8.1)

## 2022-11-01 LAB — LACTATE DEHYDROGENASE: LDH: 160 U/L (ref 98–192)

## 2022-11-01 NOTE — Progress Notes (Signed)
Christopher Briggs [address], [place] [address]    Clinic Day:  11/01/2022  Referring physician: Yong Channel*  Patient Care Team: Cathie Olden, MD as PCP - General (Family Medicine)   ASSESSMENT & PLAN:   Assessment: 1.  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 -Treated with 6 cycles of R-CHOP from November 09, 2013 through February 22, 2014 nodule. -CT CAP on 02/27/2019 with  no adenopathy identified. Normal spleen.  3 liver lesions thought to be hemangiomas.  2 liver lesions consistent with cysts.  Small exophytic lesion of the left kidney upper pole which is technically too small to characterize although could represent a cyst.  Markedly enlarged prostate gland with volume of 210 cm.  There is some asymmetric soft tissue density along the right posterior seminal vesicle.   2.  Hepatitis B surface antigen positive: -Treated with entecavir 0.5 mg daily from March 2015 through August 2015 while he was receiving chemotherapy.   3.  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 -This improved to 50 to 55% in May 2016.  Follows up with Dr. Luiz Ochoa.  Plan: 1.  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  No right upper extremity swelling. - Physical exam today did not show any evidence of lymphadenopathy or splenomegaly.  No recurrent infections reported. - Labs today shows normal LFTs.  Creatinine mildly elevated at 1.31.  LDH is normal.  CBC is normal. - Blood pressure was slightly high at 150/90 in the office.  He reports that his blood pressure has been normal at  home in the range of 130/80. - He continues to be in remission.  RTC 1 year for follow-up with repeat labs and exam.   2.  Peripheral neuropathy: - Numbness in the fingertips and feet is stable.  No pharmacological intervention.   3.  Elevated PSA: - Continue to follow-up with Dr. Harlow Asa in [place].  Orders Placed This Encounter  Procedures   CBC with Differential/Platelet    Standing Status:   Future    Standing Expiration Date:   11/01/2023    Order Specific Question:   Release to patient    Answer:   Immediate   Comprehensive metabolic panel    Standing Status:   Future    Standing Expiration Date:   11/01/2023    Order Specific Question:   Release to patient    Answer:   Immediate   Lactate dehydrogenase    Standing Status:   Future    Standing Expiration Date:   11/01/2023    Order Specific Question:   Release to patient    Answer:   Immediate      I,Alexis Herring,acting as a scribe for Derek Jack, MD.,have documented all relevant documentation on the behalf of Derek Jack, MD,as directed by  Derek Jack, MD while in the presence of Derek Jack, MD.   I, Derek Jack MD, have reviewed the above documentation for accuracy and completeness, and I agree with the above.   Derek Jack, MD   3/21/20244:25 PM  CHIEF COMPLAINT:   Diagnosis: DLBCL    Cancer Staging  No matching staging information  was found for the patient.   Prior Therapy: R-CHOP x 6 cycles from 11/09/2013 to 02/22/2014   Current Therapy:  surveillance    HISTORY OF PRESENT ILLNESS:   Oncology History  Diffuse large B cell lymphoma ([place])  10/2013 Initial Diagnosis   Diffuse large B cell lymphoma ([place])   11/09/2013 - 02/22/2014 Chemotherapy   6 cycles of R-CHOP    05/25/2014 Adverse Reaction   Congestive heart failure, anthracycline induced - Echo on 05/25/2014 shows EF of 30 to 35% - Improved EF of 50 to 55% on echo in March 2016    11/15/2017 Imaging   CT scan of the chest, abdomen and pelvis did not show any evidence of adenopathy or recurrence.      INTERVAL HISTORY:   Christopher Briggs is a 82 y.o. male presenting to clinic today for follow up of DLBCL. He was last seen by me on 09/20/21.  Today, he states that he is doing well overall. His appetite level is at 100%. His energy level is at 50%.  PAST MEDICAL HISTORY:   Past Medical History: Past Medical History:  Diagnosis Date   CHF (congestive heart failure) ([place])    Chronic kidney disease    Hepatitis B surface antigen positive    Neuropathy due to chemotherapeutic drug [facility])     Surgical History: History reviewed. No pertinent surgical history.  Social History: Social History   Socioeconomic History   Marital status: Married    Spouse name: Not on file   Number of children: Not on file   Years of education: Not on file   Highest education level: Not on file  Occupational History   Not on file  Tobacco Use   Smoking status: Not on file   Smokeless tobacco: Not on file  Substance and Sexual Activity   Alcohol use: Not on file   Drug use: Not on file   Sexual activity: Not on file  Other Topics Concern   Not on file  Social History Narrative   Not on file   Social Determinants of Health   Financial Resource Strain: Not on file  Food Insecurity: Not on file  Transportation Needs: Not on file  Physical Activity: Not on file  Stress: Not on file  Social Connections: Not on file  Intimate Partner Violence: Not on file    Family History: History reviewed. No pertinent family history.  Current Medications:  Current Outpatient Medications:    carvedilol (COREG) 6.25 MG tablet, TAKE 1 TABLET BY MOUTH TWICE DAILY FOR BLOOD PRESSURE, Disp: , Rfl:    cholecalciferol (VITAMIN D3) 25 MCG (1000 UNIT) tablet, Take 1,000 Units by mouth daily., Disp: , Rfl:    clonazePAM (KLONOPIN) 0.5 MG tablet, TAKE 1 TO 2 TABLETS BY MOUTH TWICE A DAY AS NEEDED  ANXIETY AND 1 TABLET AT BEDTIME FOR SLEEP, Disp: , Rfl: 0   finasteride (PROSCAR) 5 MG tablet, finasteride 5 mg tablet, Disp: , Rfl:    furosemide (LASIX) 40 MG tablet, TAKE 1 TABLET EVERY MORNING FOR FLUID, Disp: , Rfl:    losartan (COZAAR) 100 MG tablet, Take 100 mg by mouth daily., Disp: , Rfl:    pantoprazole (PROTONIX) 40 MG tablet, Take 40 mg by mouth daily. , Disp: , Rfl:    pravastatin (PRAVACHOL) 40 MG tablet, TAKE 1 TABLET EVERY NIGHT FOR CHOLESTEROL, Disp: , Rfl:    tamsulosin (FLOMAX) 0.4 MG CAPS capsule, Take 0.4 mg by mouth daily. , Disp: , Rfl:    terbinafine (  LAMISIL) 250 MG tablet, terbinafine HCl 250 mg tablet  TAKE 1 TABLET BY MOUTH ONCE DAILY, Disp: , Rfl:    vitamin C (ASCORBIC ACID) 500 MG tablet, Take 500 mg by mouth daily. , Disp: , Rfl:    zinc gluconate 50 MG tablet, Take 50 mg by mouth daily., Disp: , Rfl:    Allergies: Allergies  Allergen Reactions   Ace Inhibitors Other (See Comments)    cough    Atorvastatin Other (See Comments)    Dry mouth    REVIEW OF SYSTEMS:   Review of Systems  Constitutional:  Negative for chills, fatigue and fever.  HENT:   Negative for lump/mass, mouth sores, nosebleeds, sore throat and trouble swallowing.   Eyes:  Negative for eye problems.  Respiratory:  Negative for cough and shortness of breath.   Cardiovascular:  Negative for chest pain, leg swelling and palpitations.  Gastrointestinal:  Negative for abdominal pain, constipation, diarrhea, nausea and vomiting.  Genitourinary:  Positive for difficulty urinating. Negative for bladder incontinence, dysuria, frequency, hematuria and nocturia.   Musculoskeletal:  Negative for arthralgias, back pain, flank pain, myalgias and neck pain.  Skin:  Negative for itching and rash.  Neurological:  Positive for numbness. Negative for dizziness and headaches.  Hematological:  Does not bruise/bleed easily.  Psychiatric/Behavioral:  Negative for depression, sleep disturbance and suicidal  ideas. The patient is not nervous/anxious.   All other systems reviewed and are negative.    VITALS:   Blood pressure (!) 154/97, pulse 86, temperature 97.8 F (36.6 C), temperature source Oral, resp. rate 18, weight 234 lb 1.6 oz (106.2 kg), SpO2 96 %.  Wt Readings from Last 3 Encounters:  11/01/22 234 lb 1.6 oz (106.2 kg)  09/20/21 234 lb 4.8 oz (106.3 kg)  09/13/20 238 lb 12.1 oz (108.3 kg)    Body mass index is 30.89 kg/m.  Performance status (ECOG): 1 - Symptomatic but completely ambulatory  PHYSICAL EXAM:   Physical Exam Vitals and nursing note reviewed. Exam conducted with a chaperone present.  Constitutional:      Appearance: Normal appearance.  Cardiovascular:     Rate and Rhythm: Normal rate and regular rhythm.     Pulses: Normal pulses.     Heart sounds: Normal heart sounds.  Pulmonary:     Effort: Pulmonary effort is normal.     Breath sounds: Normal breath sounds.  Abdominal:     Palpations: Abdomen is soft. There is no hepatomegaly, splenomegaly or mass.     Tenderness: There is no abdominal tenderness.  Musculoskeletal:     Right lower leg: No edema.     Left lower leg: No edema.  Lymphadenopathy:     Cervical: No cervical adenopathy.     Right cervical: No superficial, deep or posterior cervical adenopathy.    Left cervical: No superficial, deep or posterior cervical adenopathy.     Upper Body:     Right upper body: No supraclavicular or axillary adenopathy.     Left upper body: No supraclavicular or axillary adenopathy.  Neurological:     General: No focal deficit present.     Mental Status: He is alert and oriented to person, place, and time.  Psychiatric:        Mood and Affect: Mood normal.        Behavior: Behavior normal.     LABS:      Latest Ref Rng & Units 11/01/2022    2:14 PM 09/20/2021   10:24 AM 09/13/2020  10:02 AM  CBC  WBC 4.0 - 10.5 K/uL 7.9  7.7  7.8   Hemoglobin 13.0 - 17.0 g/dL 16.4  16.2  16.1   Hematocrit 39.0 - 52.0 %  49.9  51.5  49.9   Platelets 150 - 400 K/uL 169  185  182       Latest Ref Rng & Units 11/01/2022    2:14 PM 09/20/2021   10:24 AM 09/13/2020   10:02 AM  CMP  Glucose 70 - 99 mg/dL 125  113  109   BUN 8 - 23 mg/dL 27  27  23    Creatinine 0.61 - 1.24 mg/dL 1.31  1.15  1.20   Sodium 135 - 145 mmol/L 136  135  140   Potassium 3.5 - 5.1 mmol/L 3.7  4.4  4.9   Chloride 98 - 111 mmol/L 105  105  105   CO2 22 - 32 mmol/L 22  24  30    Calcium 8.9 - 10.3 mg/dL 9.1  9.4  9.9   Total Protein 6.5 - 8.1 g/dL 6.7  6.7  6.9   Total Bilirubin 0.3 - 1.2 mg/dL 1.2  0.9  0.9   Alkaline Phos 38 - 126 U/L 81  74  79   AST 15 - 41 U/L 27  20  22    ALT 0 - 44 U/L 21  12  18       No results found for: "CEA1", "CEA" / No results found for: "CEA1", "CEA" No results found for: "PSA1" No results found for: "[id number]" No results found for: "CAN125"  No results found for: "TOTALPROTELP", "ALBUMINELP", "A1GS", "A2GS", "BETS", "BETA2SER", "GAMS", "MSPIKE", "SPEI" No results found for: "TIBC", "FERRITIN", "IRONPCTSAT" Lab Results  Component Value Date   LDH 160 11/01/2022   LDH 147 09/20/2021   LDH 160 09/13/2020     STUDIES:   No results found.

## 2022-11-01 NOTE — Patient Instructions (Signed)
[facility]  Discharge Instructions  You were seen and examined today by Dr. Katragadda.  Dr. Katragadda discussed your most recent lab work which revealed that everything looks good.  Follow-up as scheduled in 1 year.    Thank you for choosing [facility] to provide your oncology and hematology care.   To afford each patient quality time with our provider, please arrive at least 15 minutes before your scheduled appointment time. You may need to reschedule your appointment if you arrive late (10 or more minutes). Arriving late affects you and other patients whose appointments are after yours.  Also, if you miss three or more appointments without notifying the office, you may be dismissed from the clinic at the provider's discretion.    Again, thank you for choosing [facility].  Our hope is that these requests will decrease the amount of time that you wait before being seen by our physicians.   If you have a lab appointment with the Cancer Center please come in thru the Main Entrance and check in at the main information desk.           _____________________________________________________________  Should you have questions after your visit to [facility], please contact our office at [phone] and follow the prompts.  Our office hours are 8:00 a.m. to 4:30 p.m. Monday - Thursday and 8:00 a.m. to 2:30 p.m. Friday.  Please note that voicemails left after 4:00 p.m. may not be returned until the following business day.  We are closed weekends and all major holidays.  You do have access to a nurse 24-7, just call the main number to the clinic [phone] and do not press any options, hold on the line and a nurse will answer the phone.    For prescription refill requests, have your pharmacy contact our office and allow 72 hours.    Masks are optional in the cancer centers. If you would like for your care team  to wear a mask while they are taking care of you, please let them know. You may have one support person who is at least 82 years old accompany you for your appointments.  

## 2023-02-13 ENCOUNTER — Inpatient Hospital Stay: Payer: Medicare PPO | Attending: Hematology

## 2023-02-13 VITALS — BP 143/88 | HR 69 | Temp 97.9°F | Resp 18 | Ht 74.0 in | Wt 235.6 lb

## 2023-02-13 DIAGNOSIS — C8330 Diffuse large B-cell lymphoma, unspecified site: Secondary | ICD-10-CM | POA: Insufficient documentation

## 2023-02-13 DIAGNOSIS — C8332 Diffuse large B-cell lymphoma, intrathoracic lymph nodes: Secondary | ICD-10-CM

## 2023-02-13 DIAGNOSIS — Z452 Encounter for adjustment and management of vascular access device: Secondary | ICD-10-CM | POA: Diagnosis present

## 2023-02-13 MED ORDER — HEPARIN SOD (PORK) LOCK FLUSH 100 UNIT/ML IV SOLN
500.0000 [IU] | Freq: Once | INTRAVENOUS | Status: AC
  Administered 2023-02-13: 500 [IU] via INTRAVENOUS

## 2023-02-13 MED ORDER — SODIUM CHLORIDE 0.9% FLUSH
10.0000 mL | Freq: Once | INTRAVENOUS | Status: AC
  Administered 2023-02-13: 10 mL via INTRAVENOUS

## 2023-02-13 NOTE — Progress Notes (Signed)
Patients port flushed without difficulty.  Good blood return noted with no bruising or swelling noted at site.  Band aid applied.  VSS with discharge and left in satisfactory condition with no s/s of distress noted.   

## 2023-02-13 NOTE — Patient Instructions (Signed)
[facility]  Discharge Instructions: Thank you for choosing [facility] to provide your oncology and hematology care.  If you have a lab appointment with the Cancer Center - please note that after April 8th, 2024, all labs will be drawn in the cancer center.  You do not have to check in or register with the main entrance as you have in the past but will complete your check-in in the cancer center.  Wear comfortable clothing and clothing appropriate for easy access to any Portacath or PICC line.   We strive to give you quality time with your provider. You may need to reschedule your appointment if you arrive late (15 or more minutes).  Arriving late affects you and other patients whose appointments are after yours.  Also, if you miss three or more appointments without notifying the office, you may be dismissed from the clinic at the provider's discretion.      For prescription refill requests, have your pharmacy contact our office and allow 72 hours for refills to be completed.    Today you received the following chemotherapy and/or immunotherapy agents   To help prevent nausea and vomiting after your treatment, we encourage you to take your nausea medication as directed.  BELOW ARE SYMPTOMS THAT SHOULD BE REPORTED IMMEDIATELY: *FEVER GREATER THAN 100.4 F (38 C) OR HIGHER *CHILLS OR SWEATING *NAUSEA AND VOMITING THAT IS NOT CONTROLLED WITH YOUR NAUSEA MEDICATION *UNUSUAL SHORTNESS OF BREATH *UNUSUAL BRUISING OR BLEEDING *URINARY PROBLEMS (pain or burning when urinating, or frequent urination) *BOWEL PROBLEMS (unusual diarrhea, constipation, pain near the anus) TENDERNESS IN MOUTH AND THROAT WITH OR WITHOUT PRESENCE OF ULCERS (sore throat, sores in mouth, or a toothache) UNUSUAL RASH, SWELLING OR PAIN  UNUSUAL VAGINAL DISCHARGE OR ITCHING   Items with * indicate a potential emergency and should be followed up as soon as possible or go to the Emergency  Department if any problems should occur.  Please show the CHEMOTHERAPY ALERT CARD or IMMUNOTHERAPY ALERT CARD at check-in to the Emergency Department and triage nurse.  Should you have questions after your visit or need to cancel or reschedule your appointment, please contact [facility] [phone]  and follow the prompts.  Office hours are 8:00 a.m. to 4:30 p.m. Monday - Friday. Please note that voicemails left after 4:00 p.m. may not be returned until the following business day.  We are closed weekends and major holidays. You have access to a nurse at all times for urgent questions. Please call the main number to the clinic [phone] and follow the prompts.  For any non-urgent questions, you may also contact your provider using MyChart. We now offer e-Visits for anyone 18 and older to request care online for non-urgent symptoms. For details visit [email or web address].   Also download the MyChart app! Go to the app store, search "MyChart", open the app, select [place] [facility], and log in with your MyChart username and password.   

## 2023-02-20 ENCOUNTER — Encounter: Payer: Self-pay | Admitting: *Deleted

## 2023-02-20 NOTE — Progress Notes (Addendum)
Order faxed to [facility] @ [phone] for port flush every 3 months, per patient request. Office phone [phone].

## 2023-10-31 ENCOUNTER — Inpatient Hospital Stay: Payer: Medicare PPO | Admitting: Hematology

## 2023-10-31 ENCOUNTER — Other Ambulatory Visit: Payer: Medicare PPO

## 2023-12-25 ENCOUNTER — Other Ambulatory Visit: Payer: Self-pay

## 2023-12-25 DIAGNOSIS — C8332 Diffuse large B-cell lymphoma, intrathoracic lymph nodes: Secondary | ICD-10-CM

## 2023-12-26 ENCOUNTER — Inpatient Hospital Stay: Attending: Hematology | Admitting: Hematology

## 2023-12-26 ENCOUNTER — Inpatient Hospital Stay

## 2023-12-26 VITALS — BP 153/83 | HR 59 | Temp 97.4°F | Resp 18 | Wt 237.0 lb

## 2023-12-26 DIAGNOSIS — R7989 Other specified abnormal findings of blood chemistry: Secondary | ICD-10-CM | POA: Diagnosis not present

## 2023-12-26 DIAGNOSIS — C8332 Diffuse large B-cell lymphoma, intrathoracic lymph nodes: Secondary | ICD-10-CM | POA: Diagnosis not present

## 2023-12-26 DIAGNOSIS — R972 Elevated prostate specific antigen [PSA]: Secondary | ICD-10-CM | POA: Diagnosis not present

## 2023-12-26 DIAGNOSIS — Z8572 Personal history of non-Hodgkin lymphomas: Secondary | ICD-10-CM | POA: Diagnosis present

## 2023-12-26 DIAGNOSIS — G629 Polyneuropathy, unspecified: Secondary | ICD-10-CM | POA: Diagnosis not present

## 2023-12-26 LAB — CBC WITH DIFFERENTIAL/PLATELET
Abs Immature Granulocytes: 0.02 10*3/uL (ref 0.00–0.07)
Basophils Absolute: 0 10*3/uL (ref 0.0–0.1)
Basophils Relative: 1 %
Eosinophils Absolute: 0.3 10*3/uL (ref 0.0–0.5)
Eosinophils Relative: 5 %
HCT: 47.7 % (ref 39.0–52.0)
Hemoglobin: 16.1 g/dL (ref 13.0–17.0)
Immature Granulocytes: 0 %
Lymphocytes Relative: 21 %
Lymphs Abs: 1.3 10*3/uL (ref 0.7–4.0)
MCH: 29.4 pg (ref 26.0–34.0)
MCHC: 33.8 g/dL (ref 30.0–36.0)
MCV: 87.2 fL (ref 80.0–100.0)
Monocytes Absolute: 0.5 10*3/uL (ref 0.1–1.0)
Monocytes Relative: 8 %
Neutro Abs: 4.1 10*3/uL (ref 1.7–7.7)
Neutrophils Relative %: 65 %
Platelets: 160 10*3/uL (ref 150–400)
RBC: 5.47 MIL/uL (ref 4.22–5.81)
RDW: 13.1 % (ref 11.5–15.5)
WBC: 6.3 10*3/uL (ref 4.0–10.5)
nRBC: 0 % (ref 0.0–0.2)

## 2023-12-26 LAB — COMPREHENSIVE METABOLIC PANEL WITH GFR
ALT: 17 U/L (ref 0–44)
AST: 25 U/L (ref 15–41)
Albumin: 4 g/dL (ref 3.5–5.0)
Alkaline Phosphatase: 72 U/L (ref 38–126)
Anion gap: 8 (ref 5–15)
BUN: 32 mg/dL — ABNORMAL HIGH (ref 8–23)
CO2: 23 mmol/L (ref 22–32)
Calcium: 9.2 mg/dL (ref 8.9–10.3)
Chloride: 104 mmol/L (ref 98–111)
Creatinine, Ser: 1.75 mg/dL — ABNORMAL HIGH (ref 0.61–1.24)
GFR, Estimated: 38 mL/min — ABNORMAL LOW (ref >60)
Glucose, Bld: 117 mg/dL — ABNORMAL HIGH (ref 70–99)
Potassium: 4.4 mmol/L (ref 3.5–5.1)
Sodium: 135 mmol/L (ref 135–145)
Total Bilirubin: 1.1 mg/dL (ref 0.0–1.2)
Total Protein: 6.5 g/dL (ref 6.5–8.1)

## 2023-12-26 LAB — LACTATE DEHYDROGENASE: LDH: 160 U/L (ref 98–192)

## 2023-12-26 NOTE — Progress Notes (Signed)
 [facility] [address] [place] [address]    Clinic Day:  12/26/2023  Referring physician: Reyes Caul*  Patient Care Team: Stacie Dys, MD as PCP - General (Family Medicine)   ASSESSMENT & PLAN:   Assessment: 1.  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 -Treated with 6 cycles of R-CHOP from November 09, 2013 through February 22, 2014 nodule. -CT CAP on 02/27/2019 with  no adenopathy identified. Normal spleen.  3 liver lesions thought to be hemangiomas.  2 liver lesions consistent with cysts.  Small exophytic lesion of the left kidney upper pole which is technically too small to characterize although could represent a cyst.  Markedly enlarged prostate gland with volume of 210 cm.  There is some asymmetric soft tissue density along the right posterior seminal vesicle.   2.  Hepatitis B surface antigen positive: -Treated with entecavir 0.5 mg daily from March 2015 through August 2015 while he was receiving chemotherapy.   3.  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 -This improved to 50 to 55% in May 2016.  Follows up with Dr. Charolett Copes.  Plan: 1.  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 - Denies any infections in the last 1 year. - Physical exam: No palpable adenopathy or splenomegaly. - Labs from 12/26/2023: LDH normal.  CBC normal.  LFTs are normal. - He is 10 years after completion of chemotherapy for his lymphoma.  I have recommended that he discontinue port.  He will call surgeons office in [place]. - I am not giving him  any follow-up appointments.  Will be glad to see him as needed.   2.  Peripheral neuropathy: - Numbness in the fingertips and feet is stable.  No pharmacological intervention needed.   3.  Elevated PSA: - He is following up with urology in [place].  4.  Elevated creatinine: - Labs today shows creatinine 1.75 with BUN 32.  Previous creatinine was 1.31 on 11/01/2023 and 1.15 on 09/20/2021.  We will make a referral to nephrology in [place].  No orders of the defined types were placed in this encounter.     Christopher Briggs,acting as a Neurosurgeon for Christopher Boros, MD.,have documented all relevant documentation on the behalf of Christopher Boros, MD,as directed by  Christopher Boros, MD while in the presence of Christopher Boros, MD.  I, Christopher Boros MD, have reviewed the above documentation for accuracy and completeness, and I agree with the above.    Christopher Boros, MD   5/15/202511:34 AM  CHIEF COMPLAINT:   Diagnosis: DLBCL    Cancer Staging  No matching staging information was found for the patient.    Prior Therapy: R-CHOP x 6 cycles from 11/09/2013 to 02/22/2014   Current Therapy:  surveillance    HISTORY OF PRESENT ILLNESS:   Oncology History  Diffuse large B cell lymphoma (HCC)  10/2013 Initial Diagnosis   Diffuse large B cell lymphoma (HCC)   11/09/2013 - 02/22/2014 Chemotherapy   6 cycles of R-CHOP    05/25/2014 Adverse Reaction   Congestive heart failure, anthracycline induced - Echo on 05/25/2014 shows EF of 30  to 35% - Improved EF of 50 to 55% on echo in March 2016   11/15/2017 Imaging   CT scan of the chest, abdomen and pelvis did not show any evidence of adenopathy or recurrence.      INTERVAL HISTORY:   Christopher Briggs is a 83 y.o. male presenting to clinic today for follow up of DLBCL. He was last seen by me on 11/01/22.  Today, he states that he is doing well overall. His appetite level is at 100%. His energy level is at  75%.  PAST MEDICAL HISTORY:   Past Medical History: Past Medical History:  Diagnosis Date   CHF (congestive heart failure) (HCC)    Chronic kidney disease    Hepatitis B surface antigen positive    Neuropathy due to chemotherapeutic drug [facility])     Surgical History: No past surgical history on file.  Social History: Social History   Socioeconomic History   Marital status: Married    Spouse name: Not on file   Number of children: Not on file   Years of education: Not on file   Highest education level: Not on file  Occupational History   Not on file  Tobacco Use   Smoking status: Not on file   Smokeless tobacco: Not on file  Substance and Sexual Activity   Alcohol use: Not on file   Drug use: Not on file   Sexual activity: Not on file  Other Topics Concern   Not on file  Social History Narrative   Not on file   Social Drivers of Health   Financial Resource Strain: Not on file  Food Insecurity: Not on file  Transportation Needs: Not on file  Physical Activity: Not on file  Stress: Not on file  Social Connections: Not on file  Intimate Partner Violence: Not on file    Family History: No family history on file.  Current Medications:  Current Outpatient Medications:    carvedilol (COREG) 6.25 MG tablet, TAKE 1 TABLET BY MOUTH TWICE DAILY FOR BLOOD PRESSURE, Disp: , Rfl:    cholecalciferol (VITAMIN D3) 25 MCG (1000 UNIT) tablet, Take 1,000 Units by mouth daily., Disp: , Rfl:    clonazePAM (KLONOPIN) 0.5 MG tablet, TAKE 1 TO 2 TABLETS BY MOUTH TWICE A DAY AS NEEDED ANXIETY AND 1 TABLET AT BEDTIME FOR SLEEP, Disp: , Rfl: 0   finasteride (PROSCAR) 5 MG tablet, finasteride 5 mg tablet, Disp: , Rfl:    furosemide (LASIX) 40 MG tablet, TAKE 1 TABLET EVERY MORNING FOR FLUID, Disp: , Rfl:    losartan (COZAAR) 100 MG tablet, Take 100 mg by mouth daily., Disp: , Rfl:    pantoprazole (PROTONIX) 40 MG tablet, Take 40 mg by mouth daily. , Disp: , Rfl:    pravastatin  (PRAVACHOL) 40 MG tablet, TAKE 1 TABLET EVERY NIGHT FOR CHOLESTEROL, Disp: , Rfl:    tamsulosin (FLOMAX) 0.4 MG CAPS capsule, Take 0.4 mg by mouth daily. , Disp: , Rfl:    terbinafine (LAMISIL) 250 MG tablet, terbinafine HCl 250 mg tablet  TAKE 1 TABLET BY MOUTH ONCE DAILY, Disp: , Rfl:    valsartan-hydrochlorothiazide (DIOVAN-HCT) 160-12.5 MG tablet, Take 1 tablet by mouth daily., Disp: , Rfl:    vitamin C (ASCORBIC ACID) 500 MG tablet, Take 500 mg by mouth daily. , Disp: , Rfl:    zinc gluconate 50 MG tablet, Take 50 mg by mouth daily., Disp: , Rfl:    Allergies: Allergies  Allergen Reactions   Ace Inhibitors Other (  See Comments)    cough    Atorvastatin Other (See Comments)    Dry mouth    REVIEW OF SYSTEMS:   Review of Systems  Constitutional:  Negative for chills, fatigue and fever.  HENT:   Negative for lump/mass, mouth sores, nosebleeds, sore throat and trouble swallowing.   Eyes:  Negative for eye problems.  Respiratory:  Positive for cough and shortness of breath.   Cardiovascular:  Negative for chest pain, leg swelling and palpitations.  Gastrointestinal:  Negative for abdominal pain, constipation, diarrhea, nausea and vomiting.  Genitourinary:  Negative for bladder incontinence, difficulty urinating, dysuria, frequency, hematuria and nocturia.   Musculoskeletal:  Negative for arthralgias, back pain, flank pain, myalgias and neck pain.  Skin:  Negative for itching and rash.  Neurological:  Positive for numbness. Negative for dizziness and headaches.  Hematological:  Does not bruise/bleed easily.  Psychiatric/Behavioral:  Negative for depression, sleep disturbance and suicidal ideas. The patient is not nervous/anxious.   All other systems reviewed and are negative.    VITALS:   Blood pressure (!) 153/90, pulse (!) 59, temperature (!) 97.4 F (36.3 C), temperature source Oral, resp. rate 18, weight 236 lb 15.9 oz (107.5 kg), SpO2 95%.  Wt Readings from Last 3  Encounters:  12/26/23 236 lb 15.9 oz (107.5 kg)  02/13/23 235 lb 9.6 oz (106.9 kg)  11/01/22 234 lb 1.6 oz (106.2 kg)    Body mass index is 30.43 kg/m.  Performance status (ECOG): 1 - Symptomatic but completely ambulatory  PHYSICAL EXAM:   Physical Exam Vitals and nursing note reviewed. Exam conducted with a chaperone present.  Constitutional:      Appearance: Normal appearance.  Cardiovascular:     Rate and Rhythm: Normal rate and regular rhythm.     Pulses: Normal pulses.     Heart sounds: Normal heart sounds.  Pulmonary:     Effort: Pulmonary effort is normal.     Breath sounds: Normal breath sounds.  Abdominal:     Palpations: Abdomen is soft. There is no hepatomegaly, splenomegaly or mass.     Tenderness: There is no abdominal tenderness.  Musculoskeletal:     Right lower leg: No edema.     Left lower leg: No edema.  Lymphadenopathy:     Cervical: No cervical adenopathy.     Right cervical: No superficial, deep or posterior cervical adenopathy.    Left cervical: No superficial, deep or posterior cervical adenopathy.     Upper Body:     Right upper body: No supraclavicular or axillary adenopathy.     Left upper body: No supraclavicular or axillary adenopathy.  Neurological:     General: No focal deficit present.     Mental Status: He is alert and oriented to person, place, and time.  Psychiatric:        Mood and Affect: Mood normal.        Behavior: Behavior normal.     LABS:      Latest Ref Rng & Units 12/26/2023    9:29 AM 11/01/2022    2:14 PM 09/20/2021   10:24 AM  CBC  WBC 4.0 - 10.5 K/uL 6.3  7.9  7.7   Hemoglobin 13.0 - 17.0 g/dL 29.5  62.1  30.8   Hematocrit 39.0 - 52.0 % 47.7  49.9  51.5   Platelets 150 - 400 K/uL 160  169  185       Latest Ref Rng & Units 12/26/2023    9:29 AM 11/01/2022  2:14 PM 09/20/2021   10:24 AM  CMP  Glucose 70 - 99 mg/dL 409  811  914   BUN 8 - 23 mg/dL 32  27  27   Creatinine 0.61 - 1.24 mg/dL 7.82  9.56  2.13    Sodium 135 - 145 mmol/L 135  136  135   Potassium 3.5 - 5.1 mmol/L PENDING  3.7  4.4   Chloride 98 - 111 mmol/L 104  105  105   CO2 22 - 32 mmol/L 23  22  24    Calcium 8.9 - 10.3 mg/dL PENDING  9.1  9.4   Total Protein 6.5 - 8.1 g/dL 6.5  6.7  6.7   Total Bilirubin 0.0 - 1.2 mg/dL 1.1  1.2  0.9   Alkaline Phos 38 - 126 U/L 72  81  74   AST 15 - 41 U/L 25  27  20    ALT 0 - 44 U/L 17  21  12       No results found for: "CEA1", "CEA" / No results found for: "CEA1", "CEA" No results found for: "PSA1" No results found for: "YQM578" No results found for: "CAN125"  No results found for: "TOTALPROTELP", "ALBUMINELP", "A1GS", "A2GS", "BETS", "BETA2SER", "GAMS", "MSPIKE", "SPEI" No results found for: "TIBC", "FERRITIN", "IRONPCTSAT" Lab Results  Component Value Date   LDH 160 12/26/2023   LDH 160 11/01/2022   LDH 147 09/20/2021     STUDIES:   No results found.

## 2023-12-26 NOTE — Patient Instructions (Addendum)
 [facility] at [facility] Discharge Instructions   You were seen and examined today by Dr. Cheree Cords.  He reviewed the results of your lab work which are normal/stable.   We will see you on an as needed basis.     Thank you for choosing [facility] at [facility] to provide your oncology and hematology care.  To afford each patient quality time with our provider, please arrive at least 15 minutes before your scheduled appointment time.   If you have a lab appointment with the Cancer Center please come in thru the Main Entrance and check in at the main information desk.  You need to re-schedule your appointment should you arrive 10 or more minutes late.  We strive to give you quality time with our providers, and arriving late affects you and other patients whose appointments are after yours.  Also, if you no show three or more times for appointments you may be dismissed from the clinic at the providers discretion.     Again, thank you for choosing [facility].  Our hope is that these requests will decrease the amount of time that you wait before being seen by our physicians.       _____________________________________________________________  Should you have questions after your visit to [facility], please contact our office at [phone] and follow the prompts.  Our office hours are 8:00 a.m. and 4:30 p.m. Monday - Friday.  Please note that voicemails left after 4:00 p.m. may not be returned until the following business day.  We are closed weekends and major holidays.  You do have access to a nurse 24-7, just call the main number to the clinic [phone] and do not press any options, hold on the line and a nurse will answer the phone.    For prescription refill requests, have your pharmacy contact our office and allow 72 hours.    Due to Covid, you will need to wear a mask upon entering the hospital. If you do not  have a mask, a mask will be given to you at the Main Entrance upon arrival. For doctor visits, patients may have 1 support person age 6 or older with them. For treatment visits, patients can not have anyone with them due to social distancing guidelines and our immunocompromised population.
